# Patient Record
Sex: Female | Born: 1960 | Race: White | Hispanic: No | Marital: Married | Smoking: Current every day smoker
Health system: Southern US, Community
[De-identification: ages and names within clinical notes are randomized; demographics above are authoritative.]

## PROBLEM LIST (undated history)

## (undated) DIAGNOSIS — Z8489 Family history of other specified conditions: Secondary | ICD-10-CM

## (undated) DIAGNOSIS — K5900 Constipation, unspecified: Secondary | ICD-10-CM

## (undated) DIAGNOSIS — E785 Hyperlipidemia, unspecified: Secondary | ICD-10-CM

## (undated) DIAGNOSIS — F419 Anxiety disorder, unspecified: Secondary | ICD-10-CM

## (undated) DIAGNOSIS — F32A Depression, unspecified: Secondary | ICD-10-CM

## (undated) DIAGNOSIS — IMO0001 Reserved for inherently not codable concepts without codable children: Secondary | ICD-10-CM

## (undated) DIAGNOSIS — M519 Unspecified thoracic, thoracolumbar and lumbosacral intervertebral disc disorder: Secondary | ICD-10-CM

## (undated) DIAGNOSIS — T7840XA Allergy, unspecified, initial encounter: Secondary | ICD-10-CM

## (undated) DIAGNOSIS — K589 Irritable bowel syndrome without diarrhea: Secondary | ICD-10-CM

## (undated) DIAGNOSIS — R51 Headache: Secondary | ICD-10-CM

## (undated) DIAGNOSIS — K635 Polyp of colon: Secondary | ICD-10-CM

## (undated) HISTORY — DX: Allergy, unspecified, initial encounter: T78.40XA

## (undated) HISTORY — PX: REDUCTION MAMMAPLASTY: SUR839

## (undated) HISTORY — DX: Irritable bowel syndrome, unspecified: K58.9

## (undated) HISTORY — DX: Constipation, unspecified: K59.00

## (undated) HISTORY — PX: COLONOSCOPY: SHX174

## (undated) HISTORY — PX: ABDOMINAL HYSTERECTOMY: SHX81

## (undated) HISTORY — DX: Polyp of colon: K63.5

## (undated) HISTORY — PX: TONSILLECTOMY: SUR1361

## (undated) HISTORY — DX: Depression, unspecified: F32.A

## (undated) HISTORY — DX: Hyperlipidemia, unspecified: E78.5

## (undated) HISTORY — DX: Unspecified thoracic, thoracolumbar and lumbosacral intervertebral disc disorder: M51.9

## (undated) HISTORY — PX: POLYPECTOMY: SHX149

## (undated) HISTORY — PX: BACK SURGERY: SHX140

---

## 1998-12-15 ENCOUNTER — Ambulatory Visit (HOSPITAL_COMMUNITY): Admission: RE | Admit: 1998-12-15 | Discharge: 1998-12-15 | Payer: Self-pay | Admitting: Family Medicine

## 1998-12-15 ENCOUNTER — Encounter: Payer: Self-pay | Admitting: Family Medicine

## 1998-12-21 ENCOUNTER — Encounter: Admission: RE | Admit: 1998-12-21 | Discharge: 1999-03-21 | Payer: Self-pay | Admitting: Family Medicine

## 1999-07-12 ENCOUNTER — Encounter: Payer: Self-pay | Admitting: Family Medicine

## 1999-07-12 ENCOUNTER — Ambulatory Visit (HOSPITAL_COMMUNITY): Admission: RE | Admit: 1999-07-12 | Discharge: 1999-07-12 | Payer: Self-pay | Admitting: Family Medicine

## 2000-12-26 ENCOUNTER — Ambulatory Visit (HOSPITAL_COMMUNITY): Admission: RE | Admit: 2000-12-26 | Discharge: 2000-12-26 | Payer: Self-pay | Admitting: Unknown Physician Specialty

## 2000-12-26 ENCOUNTER — Encounter: Payer: Self-pay | Admitting: Family Medicine

## 2001-12-26 ENCOUNTER — Ambulatory Visit (HOSPITAL_COMMUNITY): Admission: RE | Admit: 2001-12-26 | Discharge: 2001-12-26 | Payer: Self-pay | Admitting: Internal Medicine

## 2001-12-26 ENCOUNTER — Encounter: Payer: Self-pay | Admitting: Internal Medicine

## 2001-12-26 ENCOUNTER — Encounter (INDEPENDENT_AMBULATORY_CARE_PROVIDER_SITE_OTHER): Payer: Self-pay | Admitting: Specialist

## 2001-12-26 DIAGNOSIS — K449 Diaphragmatic hernia without obstruction or gangrene: Secondary | ICD-10-CM | POA: Insufficient documentation

## 2001-12-26 DIAGNOSIS — D126 Benign neoplasm of colon, unspecified: Secondary | ICD-10-CM | POA: Insufficient documentation

## 2002-02-06 ENCOUNTER — Encounter: Payer: Self-pay | Admitting: Family Medicine

## 2002-02-06 ENCOUNTER — Ambulatory Visit (HOSPITAL_COMMUNITY): Admission: RE | Admit: 2002-02-06 | Discharge: 2002-02-06 | Payer: Self-pay | Admitting: Family Medicine

## 2003-03-05 ENCOUNTER — Ambulatory Visit (HOSPITAL_COMMUNITY): Admission: RE | Admit: 2003-03-05 | Discharge: 2003-03-05 | Payer: Self-pay | Admitting: Family Medicine

## 2003-03-05 ENCOUNTER — Encounter: Payer: Self-pay | Admitting: Family Medicine

## 2003-08-06 ENCOUNTER — Encounter: Admission: RE | Admit: 2003-08-06 | Discharge: 2003-08-06 | Payer: Self-pay | Admitting: Orthopaedic Surgery

## 2003-08-20 ENCOUNTER — Encounter: Admission: RE | Admit: 2003-08-20 | Discharge: 2003-08-20 | Payer: Self-pay | Admitting: Orthopaedic Surgery

## 2003-09-17 ENCOUNTER — Ambulatory Visit (HOSPITAL_COMMUNITY): Admission: RE | Admit: 2003-09-17 | Discharge: 2003-09-18 | Payer: Self-pay | Admitting: Orthopaedic Surgery

## 2004-03-27 ENCOUNTER — Ambulatory Visit (HOSPITAL_COMMUNITY): Admission: RE | Admit: 2004-03-27 | Discharge: 2004-03-27 | Payer: Self-pay | Admitting: Family Medicine

## 2004-07-19 ENCOUNTER — Other Ambulatory Visit: Admission: RE | Admit: 2004-07-19 | Discharge: 2004-07-19 | Payer: Self-pay | Admitting: Family Medicine

## 2005-04-16 ENCOUNTER — Ambulatory Visit (HOSPITAL_COMMUNITY): Admission: RE | Admit: 2005-04-16 | Discharge: 2005-04-16 | Payer: Self-pay | Admitting: Family Medicine

## 2005-05-01 ENCOUNTER — Ambulatory Visit: Payer: Self-pay | Admitting: Internal Medicine

## 2006-04-17 ENCOUNTER — Ambulatory Visit (HOSPITAL_COMMUNITY): Admission: RE | Admit: 2006-04-17 | Discharge: 2006-04-17 | Payer: Self-pay | Admitting: Family Medicine

## 2007-05-14 ENCOUNTER — Ambulatory Visit (HOSPITAL_COMMUNITY): Admission: RE | Admit: 2007-05-14 | Discharge: 2007-05-14 | Payer: Self-pay | Admitting: Family Medicine

## 2007-08-19 ENCOUNTER — Ambulatory Visit: Payer: Self-pay | Admitting: Internal Medicine

## 2007-11-25 DIAGNOSIS — K589 Irritable bowel syndrome without diarrhea: Secondary | ICD-10-CM | POA: Insufficient documentation

## 2007-11-25 DIAGNOSIS — F329 Major depressive disorder, single episode, unspecified: Secondary | ICD-10-CM | POA: Insufficient documentation

## 2007-11-25 DIAGNOSIS — K219 Gastro-esophageal reflux disease without esophagitis: Secondary | ICD-10-CM | POA: Insufficient documentation

## 2007-12-18 ENCOUNTER — Ambulatory Visit: Payer: Self-pay | Admitting: Internal Medicine

## 2008-05-19 ENCOUNTER — Ambulatory Visit (HOSPITAL_COMMUNITY): Admission: RE | Admit: 2008-05-19 | Discharge: 2008-05-19 | Payer: Self-pay | Admitting: Family Medicine

## 2008-06-02 ENCOUNTER — Ambulatory Visit: Payer: Self-pay | Admitting: Family Medicine

## 2008-06-02 DIAGNOSIS — J329 Chronic sinusitis, unspecified: Secondary | ICD-10-CM | POA: Insufficient documentation

## 2008-08-31 ENCOUNTER — Ambulatory Visit: Payer: Self-pay | Admitting: Family Medicine

## 2008-08-31 DIAGNOSIS — J069 Acute upper respiratory infection, unspecified: Secondary | ICD-10-CM | POA: Insufficient documentation

## 2009-05-23 ENCOUNTER — Ambulatory Visit (HOSPITAL_COMMUNITY): Admission: RE | Admit: 2009-05-23 | Discharge: 2009-05-23 | Payer: Self-pay | Admitting: Family Medicine

## 2009-12-12 ENCOUNTER — Encounter (INDEPENDENT_AMBULATORY_CARE_PROVIDER_SITE_OTHER): Payer: Self-pay | Admitting: *Deleted

## 2010-01-13 ENCOUNTER — Encounter (INDEPENDENT_AMBULATORY_CARE_PROVIDER_SITE_OTHER): Payer: Self-pay | Admitting: *Deleted

## 2010-02-14 ENCOUNTER — Encounter (INDEPENDENT_AMBULATORY_CARE_PROVIDER_SITE_OTHER): Payer: Self-pay | Admitting: *Deleted

## 2010-02-15 ENCOUNTER — Ambulatory Visit: Payer: Self-pay | Admitting: Internal Medicine

## 2010-03-01 ENCOUNTER — Ambulatory Visit: Payer: Self-pay | Admitting: Internal Medicine

## 2010-05-26 ENCOUNTER — Ambulatory Visit (HOSPITAL_COMMUNITY): Admission: RE | Admit: 2010-05-26 | Discharge: 2010-05-26 | Payer: Self-pay | Admitting: Family Medicine

## 2010-06-15 ENCOUNTER — Encounter: Admission: RE | Admit: 2010-06-15 | Discharge: 2010-06-15 | Payer: Self-pay | Admitting: Family Medicine

## 2010-10-08 HISTORY — PX: BREAST SURGERY: SHX581

## 2010-11-07 NOTE — Letter (Signed)
Summary: Colonoscopy Letter  [facility]  [address] [place], [place] [address]   Phone: [phone]  Fax: [phone]      December 12, 2009 MRN: [id number]   [facility] [address] [place], [place]  [address]   Dear Ms. Tuff,   According to your medical record, it is time for you to schedule a Colonoscopy. The American Cancer Society recommends this procedure as a method to detect early colon cancer. Patients with a family history of colon cancer, or a personal history of colon polyps or inflammatory bowel disease are at increased risk.  This letter has been generated based on the recommendations made at the time of your procedure. If you feel that in your particular situation this may no longer apply, please contact our office.  Please call our office at [phone] to schedule this appointment or to update your records at your earliest convenience.  Thank you for cooperating with [place] to provide you with the very best care possible.   Sincerely,  Wilhemina Bonito. Marina Goodell, M.D.  [facility] [phone]

## 2010-11-07 NOTE — Procedures (Signed)
Summary: Colonoscopy  Patient: Stephanie Deleon Note: All result statuses are Final unless otherwise noted.  Tests: (1) Colonoscopy (COL)   COL Colonoscopy           DONE     Stephanie Deleon     [address]     [place], [place]  [address]           COLONOSCOPY PROCEDURE REPORT           PATIENT:  Stephanie, Deleon  MR#:  [id number]     BIRTHDATE:  [birth year], 49 yrs. old  GENDER:  female     ENDOSCOPIST:  Wilhemina Bonito. Eda Keys, MD     REF. BY:  Screening / Recall     PROCEDURE DATE:  03/01/2010     PROCEDURE:  Higher-risk screening colonoscopy G0105           ASA CLASS:  Class I     INDICATIONS:  family history of colon cancer ; PARENT 60'S.     NEGATIVE COLONOSCOPY 2003     MEDICATIONS:   Fentanyl 100 mcg IV, Versed 10 mg IV           DESCRIPTION OF PROCEDURE:   After the risks benefits and     alternatives of the procedure were thoroughly explained, informed     consent was obtained.  Digital rectal exam was performed and     revealed no abnormalities.   The LB CF-H180AL [id number] endoscope     was introduced through the anus and advanced to the cecum, which     was identified by both the appendix and ileocecal valve, without     limitations.TIME TO CECUM = 4:26 MIN.The quality of the prep was     excellent, using MoviPrep.  The instrument was then slowly     withdrawn (TIME = 12:49 MIN) as the colon was fully examined.     <<PROCEDUREIMAGES>>           FINDINGS:  A normal appearing cecum, ileocecal valve, and     appendiceal orifice were identified. The ascending, hepatic     flexure, transverse, splenic flexure, descending, sigmoid colon,     and rectum appeared unremarkable.  No polyps or cancers were seen.     Retroflexed views in the rectum revealed no abnormalities.    The     scope was then withdrawn from the patient and the procedure     completed.           COMPLICATIONS:  None     ENDOSCOPIC IMPRESSION:     1) Normal colon     2) No polyps or cancers        RECOMMENDATIONS:     1) Follow up colonoscopy in 5 years for Family history           ______________________________     Wilhemina Bonito. Eda Keys, MD           CC:  Holley Bouche, MD; The Patient           n.     eSIGNED:   Wilhemina Bonito. Eda Keys at 03/01/2010 11:41 AM           [place], [id number]  Note: An exclamation mark (!) indicates a result that was not dispersed into the flowsheet. Document Creation Date: 03/01/2010 11:42 AM _______________________________________________________________________  (1) Order result status: Final Collection or observation date-time: 03/01/2010 11:36 Requested date-time:  Receipt date-time:  Reported date-time:  Referring Physician:  Ordering Physician: Fransico Setters [phone]) Specimen Source:  Source: Launa Grill Order Number: [id number] Lab site:   Appended Document: Colonoscopy    Clinical Lists Changes  Observations: Added new observation of COLONNXTDUE: 02/2015 (03/01/2010 14:37)

## 2010-11-07 NOTE — Miscellaneous (Signed)
Summary: [facility] previsit  Clinical Lists Changes  Medications: Added new medication of MOVIPREP 100 GM  SOLR (PEG-KCL-NACL-NASULF-NA ASC-C) As per prep instructions. - Signed Rx of MOVIPREP 100 GM  SOLR (PEG-KCL-NACL-NASULF-NA ASC-C) As per prep instructions.;  #1 x 0;  Signed;  Entered by: Karl Bales RN;  Authorized by: Hilarie Fredrickson MD;  Method used: Electronically to [id number]*, [address] [place], [place], [place]  [address], Ph: [phone], Fax: [phone] Observations: Added new observation of NKA: T (02/15/2010 16:03)    Prescriptions: MOVIPREP 100 GM  SOLR (PEG-KCL-NACL-NASULF-NA ASC-C) As per prep instructions.  #1 x 0   Entered by:   Karl Bales RN   Authorized by:   Hilarie Fredrickson MD   Signed by:   Karl Bales RN on 02/15/2010   Method used:   Electronically to        [facility] [phone]* (retail)       [address] [place], [place]  [address]       Ph: [phone]       Fax: [phone]   RxID:   [id number]

## 2010-11-07 NOTE — Letter (Signed)
Summary: [facility] Instructions  [facility]  [address] [place], [place] [address]   Phone: [phone]  Fax: [phone]       Stephanie Deleon    10/02/1961    MRN: [id number]        Procedure Day /Date:WEDNESDAY  03/01/10     Arrival Time:  9:00am     Procedure Time:  10:00am     Location of Procedure:                    Juliann Pares _  [facility] (4th Floor)                        PREPARATION FOR COLONOSCOPY WITH MOVIPREP   Starting 5 days prior to your procedure  FRIDAY 05/20  do not eat nuts, seeds, popcorn, corn, beans, peas,  salads, or any raw vegetables.  Do not take any fiber supplements (e.g. Metamucil, Citrucel, and Benefiber).  THE DAY BEFORE YOUR PROCEDURE         DATE: TUESDAY 05/24  1.  Drink clear liquids the entire day-NO SOLID FOOD  2.  Do not drink anything colored red or purple.  Avoid juices with pulp.  No orange juice.  3.  Drink at least 64 oz. (8 glasses) of fluid/clear liquids during the day to prevent dehydration and help the prep work efficiently.  CLEAR LIQUIDS INCLUDE: Water Jello Ice Popsicles Tea (sugar ok, no milk/cream) Powdered fruit flavored drinks Coffee (sugar ok, no milk/cream) Gatorade Juice: apple, white grape, white cranberry  Lemonade Clear bullion, consomm, broth Carbonated beverages (any kind) Strained chicken noodle soup Hard Candy                             4.  In the morning, mix first dose of MoviPrep solution:    Empty 1 Pouch A and 1 Pouch B into the disposable container    Add lukewarm drinking water to the top line of the container. Mix to dissolve    Refrigerate (mixed solution should be used within 24 hrs)  5.  Begin drinking the prep at 5:00 p.m. The MoviPrep container is divided by 4 marks.   Every 15 minutes drink the solution down to the next mark (approximately 8 oz) until the full liter is complete.   6.  Follow completed prep with 16 oz of clear liquid of your choice  (Nothing red or purple).  Continue to drink clear liquids until bedtime.  7.  Before going to bed, mix second dose of MoviPrep solution:    Empty 1 Pouch A and 1 Pouch B into the disposable container    Add lukewarm drinking water to the top line of the container. Mix to dissolve    Refrigerate  THE DAY OF YOUR PROCEDURE      DATE: [facility]  05/25  Beginning at 5:00 a.m. (5 hours before procedure):         1. Every 15 minutes, drink the solution down to the next mark (approx 8 oz) until the full liter is complete.  2. Follow completed prep with 16 oz. of clear liquid of your choice.    3. You may drink clear liquids until 8:00am  (2 HOURS BEFORE PROCEDURE).   MEDICATION INSTRUCTIONS  Unless otherwise instructed, you should take regular prescription medications with a small sip of water   as early as possible the morning  of your procedure.        OTHER INSTRUCTIONS  You will need a responsible adult at least 52 years of age to accompany you and drive you home.   This person must remain in the waiting room during your procedure.  Wear loose fitting clothing that is easily removed.  Leave jewelry and other valuables at home.  However, you may wish to bring a book to read or  an iPod/MP3 player to listen to music as you wait for your procedure to start.  Remove all body piercing jewelry and leave at home.  Total time from sign-in until discharge is approximately 2-3 hours.  You should go home directly after your procedure and rest.  You can resume normal activities the  day after your procedure.  The day of your procedure you should not:   Drive   Make legal decisions   Operate machinery   Drink alcohol   Return to work  You will receive specific instructions about eating, activities and medications before you leave.    The above instructions have been reviewed and explained to me by  Karl Bales RN  Feb 15, 2010 4:22 PM    I fully understand and  can verbalize these instructions _____________________________ Date _________

## 2010-11-07 NOTE — Letter (Signed)
Summary: Previsit letter  [facility]  [address] [place], [place] [address]   Phone: [phone]  Fax: [phone]       01/13/2010 MRN: [id number]  [facility] [address] [place], [place]  [address]  Dear Ms. Librarian, academic,  Welcome to the Gastroenterology Division at [facility].    You are scheduled to see a nurse for your pre-procedure visit on 02-15-10 at 4:30p.m. on the 3rd floor at [facility], [address]  We ask that you try to arrive at our office 15 minutes prior to your appointment time to allow for check-in.  Your nurse visit will consist of discussing your medical and surgical history, your immediate family medical history, and your medications.    Please bring a complete list of all your medications or, if you prefer, bring the medication bottles and we will list them.  We will need to be aware of both prescribed and over the counter drugs.  We will need to know exact dosage information as well.  If you are on blood thinners (Coumadin, Plavix, Aggrenox, Ticlid, etc.) please call our office today/prior to your appointment, as we need to consult with your physician about holding your medication.   Please be prepared to read and sign documents such as consent forms, a financial agreement, and acknowledgement forms.  If necessary, and with your consent, a friend or relative is welcome to sit-in on the nurse visit with you.  Please bring your insurance card so that we may make a copy of it.  If your insurance requires a referral to see a specialist, please bring your referral form from your primary care physician.  No co-pay is required for this nurse visit.     If you cannot keep your appointment, please call [phone] to cancel or reschedule prior to your appointment date.  This allows [place] the opportunity to schedule an appointment for another patient in need of care.    Thank you for choosing [facility] for your medical  needs.  We appreciate the opportunity to care for you.  Please visit [place] at our website  to learn more about our practice.                     Sincerely.                                                                                                                   The Gastroenterology Division

## 2011-02-20 NOTE — Assessment & Plan Note (Signed)
[facility]                         GASTROENTEROLOGY OFFICE NOTE   Stephanie Deleon, Stephanie Deleon                     MRN:          [id number]  DATE:08/19/2007                            DOB:          1961/08/31    REASON FOR EVALUATION:  Abdominal discomfort, constipation, and need for  colonoscopy.   HISTORY:  This is a 50 year old white female with a history of  constipation, predominant irritable bowel syndrome, who presents today  regarding abdominal complaints and the need for colonoscopy.  Patient  has longstanding problems with constipation associated with bloating and  abdominal discomfort.  Symptoms are improved post-defecation.  She has  taken multiple agents over the years to help her bowels with variable  success.  Currently taking nothing on a regular basis.  She had been on  MiraLax, although has not been using it regularly.  Her last colonoscopy  was performed in March, 2003.  She was found to have a diminutive  hyperplastic colon polyp.  No other abnormalities.  Since that exam, her  father was diagnosed with colon cancer and had a large of his colon  surgically resected.  This was about four years ago.   PAST MEDICAL HISTORY:  Unremarkable.   PAST SURGICAL HISTORY:  1. Hysterectomy.  2. Lumbar diskectomy.   ALLERGIES:  No known drug allergies.   CURRENT MEDICATIONS:  1. Wellbutrin XR 300 mg daily.  2. Esterace 2 mg daily.   FAMILY HISTORY:  Father with colon cancer.   SOCIAL HISTORY:  Patient is remarried.  She has three children.  She  smokes.   PHYSICAL EXAMINATION:  A well-appearing female in no acute distress.  Blood pressure 108/68, heart rate 68.  Weight is 188 pounds.  HEENT:  Sclerae are anicteric.  Conjunctivae are pink.  Oral mucosa  intact.  No adenopathy.  LUNGS:  Clear.  HEART:  Regular.  ABDOMEN:  Soft without tenderness, mass, or hernia.  Good bowel sounds  heard.   IMPRESSION:  1. Constipation, predominant  irritable bowel syndrome, ongoing.  2. Family history of colon cancer in a first degree relative.  3. Personal history of hyperplastic colon polyp.   RECOMMENDATIONS:  1. MiraLax daily.  Titrate to need.  2. Schedule colonoscopy with polypectomy if necessary.  The nature of      the procedure, as well as risks, benefits and alternatives were      reviewed in detail.  She understood and agreed to proceed.  3. Ongoing general medical care with Dr. Tiburcio Pea.     Wilhemina Bonito. Marina Goodell, MD  Electronically Signed    JNP/MedQ  DD: 08/19/2007  DT: 08/19/2007  Job #: [id number]   cc:   Holley Bouche, M.D.

## 2011-02-23 NOTE — Op Note (Signed)
Stephanie Deleon, KISNER                     ACCOUNT NO.:  [id number]   MEDICAL RECORD NO.:  [id number]                   PATIENT TYPE:  OIB   LOCATION:  2899                                 FACILITY:  [facility]   PHYSICIAN:  Mark C. Ophelia Charter, M.D.                 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   PROCEDURE:  Right L3-4 microdiskectomy.   SURGEON:  Mark C. Ophelia Charter, M.D.   ASSISTANT:  Genene Churn. Denton Meek.   ANESTHESIA:  General endotracheal anesthesia with orotracheal intubation.   ESTIMATED BLOOD LOSS:  Less than 5 mL.   PROCEDURE:  After induction of general anesthesia with orotracheal  intubation and preoperative Ancef prophylaxis the patient was placed on the  Andrews frame in the LOA position with careful padding. The back was prepped  with Duraprep, the area was scrubbed with towels, Betadine, Vi-Drape  application and laminectomy sheet.  A cross table lateral x-ray confirmed  the appropriate placement of the spinal needle at the 3-4 space.  An  incision was made a couple of mm off the midline at the 3-4 space using the  spinal needle position and the middle of the incision.  A subperiosteal  dissection of the lamina was performed and the [facility] retractor was placed  lateral with a five pound weight.  Laminotomies were performed at L3-4 and  subset over the annulus was removed with a Kerrison rongeur.  The operative  microscope was draped and brought in and with the use of the microscope the  ligamentum was incised with a 15 scalpel blade and removed with the  Kerrison.  A pad is used to protect the dura.  The foramina was enlarged  slightly.  The patient had a lateral disk and actually there was compression  of  the L4 nerve root far laterally.  Some veins in the gutter were  coagulated with the bipolar cautery and the Scarlette Slice was used to pull the dura  slightly over towards the midline.  The annulus was incised, passes were  made with straight initially and then a micro down biter and a pituitary.  Using the hockey stick sweeping out underneath the annulus followed by the  regular down biter, several large chunks of disk were removed.  An Epstein  curette was used, large size, and by pushing outside of the disk pieces were  pushed back toward the middle and then grasped from the inside with the  micro down biter and then delivered.  This was repeated.  The nerve root was  gently palpated with the tipped hockey stick in the area where it was  compressed and once the disk fragments were removed and the nerve  root was  completely decompressed, the operative field was irrigated with saline  solution.  Some passes were made at the midline and there was only a minimal  amount of remaining degenerative material.  The upper field was dry and the  microscope was removed.  The Scarlette Slice was removed.  The fascia was closed with  0 Vicryl, 2-0 Vicryl on the subcutaneous tissue, 3-0 Prolene subcuticular  skin closure, tincture of Benzoin, Steri-Strips and Marcaine infiltration in  the skin along with a postoperative dressing.  The patient was transferred  to the recovery room in stable condition.                                              Mark C. Ophelia Charter, M.D.   MCY/MEDQ  D:  09/17/2003  T:  09/18/2003  Job:  [id number]

## 2011-07-18 ENCOUNTER — Other Ambulatory Visit (HOSPITAL_COMMUNITY): Payer: Self-pay | Admitting: Family Medicine

## 2011-09-03 ENCOUNTER — Other Ambulatory Visit: Payer: Self-pay | Admitting: *Deleted

## 2011-09-03 DIAGNOSIS — N644 Mastodynia: Secondary | ICD-10-CM

## 2011-09-20 ENCOUNTER — Other Ambulatory Visit: Payer: Self-pay | Admitting: *Deleted

## 2011-09-20 ENCOUNTER — Ambulatory Visit
Admission: RE | Admit: 2011-09-20 | Discharge: 2011-09-20 | Disposition: A | Discharge status: Home / Self Care | Payer: Federal, State, Local not specified - PPO | Source: Ambulatory Visit | Attending: *Deleted | Admitting: *Deleted

## 2011-09-20 DIAGNOSIS — N644 Mastodynia: Secondary | ICD-10-CM

## 2011-09-20 DIAGNOSIS — N6002 Solitary cyst of left breast: Secondary | ICD-10-CM

## 2012-02-25 ENCOUNTER — Other Ambulatory Visit: Payer: Self-pay | Admitting: Family Medicine

## 2012-02-25 DIAGNOSIS — N644 Mastodynia: Secondary | ICD-10-CM

## 2012-03-04 ENCOUNTER — Ambulatory Visit
Admission: RE | Admit: 2012-03-04 | Discharge: 2012-03-04 | Disposition: A | Discharge status: Home / Self Care | Payer: Federal, State, Local not specified - PPO | Source: Ambulatory Visit | Attending: Family Medicine | Admitting: Family Medicine

## 2012-03-04 ENCOUNTER — Other Ambulatory Visit: Payer: Self-pay | Admitting: Family Medicine

## 2012-03-04 DIAGNOSIS — N644 Mastodynia: Secondary | ICD-10-CM

## 2012-10-23 ENCOUNTER — Other Ambulatory Visit (HOSPITAL_COMMUNITY): Payer: Self-pay | Admitting: Family Medicine

## 2012-10-23 DIAGNOSIS — Z1231 Encounter for screening mammogram for malignant neoplasm of breast: Secondary | ICD-10-CM

## 2012-10-31 ENCOUNTER — Ambulatory Visit (HOSPITAL_COMMUNITY): Payer: Federal, State, Local not specified - PPO

## 2012-11-07 ENCOUNTER — Ambulatory Visit (HOSPITAL_COMMUNITY)
Admission: RE | Admit: 2012-11-07 | Discharge: 2012-11-07 | Disposition: A | Discharge status: Home / Self Care | Payer: Federal, State, Local not specified - PPO | Source: Ambulatory Visit | Attending: Family Medicine | Admitting: Family Medicine

## 2012-11-07 DIAGNOSIS — Z1231 Encounter for screening mammogram for malignant neoplasm of breast: Secondary | ICD-10-CM | POA: Insufficient documentation

## 2012-11-11 ENCOUNTER — Other Ambulatory Visit: Payer: Self-pay | Admitting: Family Medicine

## 2012-11-11 DIAGNOSIS — R928 Other abnormal and inconclusive findings on diagnostic imaging of breast: Secondary | ICD-10-CM

## 2012-11-25 ENCOUNTER — Ambulatory Visit
Admission: RE | Admit: 2012-11-25 | Discharge: 2012-11-25 | Disposition: A | Discharge status: Home / Self Care | Payer: Federal, State, Local not specified - PPO | Source: Ambulatory Visit | Attending: Family Medicine | Admitting: Family Medicine

## 2012-11-25 DIAGNOSIS — R928 Other abnormal and inconclusive findings on diagnostic imaging of breast: Secondary | ICD-10-CM

## 2013-02-12 ENCOUNTER — Telehealth: Payer: Self-pay | Admitting: Internal Medicine

## 2013-02-12 ENCOUNTER — Ambulatory Visit: Payer: Federal, State, Local not specified - PPO | Admitting: Nurse Practitioner

## 2013-02-12 NOTE — Telephone Encounter (Signed)
Pt states she was seen in the ER at [facility] for nausea, vomiting, and abdominal pain. Pt states she is still not better. Pt requesting to be seen. Pt scheduled to see Willette Cluster NP today at 2pm. Pt aware of appt date and time. Called and requested records from [facility] be faxed from her ER visit.

## 2013-04-22 ENCOUNTER — Other Ambulatory Visit: Payer: Self-pay | Admitting: Family Medicine

## 2013-04-22 DIAGNOSIS — R921 Mammographic calcification found on diagnostic imaging of breast: Secondary | ICD-10-CM

## 2013-06-15 ENCOUNTER — Other Ambulatory Visit: Payer: Self-pay | Admitting: Family Medicine

## 2013-06-15 ENCOUNTER — Ambulatory Visit
Admission: RE | Admit: 2013-06-15 | Discharge: 2013-06-15 | Disposition: A | Discharge status: Home / Self Care | Payer: Federal, State, Local not specified - PPO | Source: Ambulatory Visit | Attending: Family Medicine | Admitting: Family Medicine

## 2013-06-15 DIAGNOSIS — R921 Mammographic calcification found on diagnostic imaging of breast: Secondary | ICD-10-CM

## 2013-12-01 ENCOUNTER — Other Ambulatory Visit: Payer: Self-pay | Admitting: Orthopaedic Surgery

## 2013-12-01 DIAGNOSIS — M47812 Spondylosis without myelopathy or radiculopathy, cervical region: Secondary | ICD-10-CM

## 2013-12-01 DIAGNOSIS — M79602 Pain in left arm: Secondary | ICD-10-CM

## 2013-12-06 DIAGNOSIS — IMO0001 Reserved for inherently not codable concepts without codable children: Secondary | ICD-10-CM

## 2013-12-06 HISTORY — DX: Reserved for inherently not codable concepts without codable children: IMO0001

## 2013-12-08 ENCOUNTER — Ambulatory Visit
Admission: RE | Admit: 2013-12-08 | Discharge: 2013-12-08 | Disposition: A | Discharge status: Home / Self Care | Payer: Federal, State, Local not specified - PPO | Source: Ambulatory Visit | Attending: Orthopaedic Surgery | Admitting: Orthopaedic Surgery

## 2013-12-08 DIAGNOSIS — M79602 Pain in left arm: Secondary | ICD-10-CM

## 2013-12-08 DIAGNOSIS — M47812 Spondylosis without myelopathy or radiculopathy, cervical region: Secondary | ICD-10-CM

## 2013-12-25 ENCOUNTER — Other Ambulatory Visit (HOSPITAL_COMMUNITY): Payer: Self-pay | Admitting: Orthopaedic Surgery

## 2013-12-28 ENCOUNTER — Encounter (HOSPITAL_COMMUNITY): Payer: Self-pay | Admitting: Pharmacy Technician

## 2013-12-30 ENCOUNTER — Encounter (HOSPITAL_COMMUNITY)
Admission: RE | Admit: 2013-12-30 | Discharge: 2013-12-30 | Disposition: A | Discharge status: Home / Self Care | Payer: Federal, State, Local not specified - PPO | Source: Ambulatory Visit | Attending: Orthopaedic Surgery | Admitting: Orthopaedic Surgery

## 2013-12-30 ENCOUNTER — Ambulatory Visit (HOSPITAL_COMMUNITY)
Admission: RE | Admit: 2013-12-30 | Discharge: 2013-12-30 | Disposition: A | Discharge status: Home / Self Care | Payer: Federal, State, Local not specified - PPO | Source: Ambulatory Visit | Attending: Orthopaedic Surgery | Admitting: Orthopaedic Surgery

## 2013-12-30 ENCOUNTER — Encounter (HOSPITAL_COMMUNITY): Payer: Self-pay

## 2013-12-30 DIAGNOSIS — Z01818 Encounter for other preprocedural examination: Secondary | ICD-10-CM | POA: Insufficient documentation

## 2013-12-30 DIAGNOSIS — R059 Cough, unspecified: Secondary | ICD-10-CM | POA: Insufficient documentation

## 2013-12-30 DIAGNOSIS — Z01810 Encounter for preprocedural cardiovascular examination: Secondary | ICD-10-CM | POA: Insufficient documentation

## 2013-12-30 DIAGNOSIS — R05 Cough: Secondary | ICD-10-CM | POA: Insufficient documentation

## 2013-12-30 DIAGNOSIS — J3489 Other specified disorders of nose and nasal sinuses: Secondary | ICD-10-CM | POA: Insufficient documentation

## 2013-12-30 HISTORY — DX: Reserved for inherently not codable concepts without codable children: IMO0001

## 2013-12-30 HISTORY — DX: Family history of other specified conditions: Z84.89

## 2013-12-30 HISTORY — DX: Anxiety disorder, unspecified: F41.9

## 2013-12-30 HISTORY — DX: Headache: R51

## 2013-12-30 LAB — COMPREHENSIVE METABOLIC PANEL
ALK PHOS: 100 U/L (ref 39–117)
ALT: 36 U/L — AB (ref 0–35)
AST: 28 U/L (ref 0–37)
Albumin: 3.9 g/dL (ref 3.5–5.2)
BUN: 15 mg/dL (ref 6–23)
CO2: 28 meq/L (ref 19–32)
Calcium: 9.5 mg/dL (ref 8.4–10.5)
Chloride: 100 mEq/L (ref 96–112)
Creatinine, Ser: 0.92 mg/dL (ref 0.50–1.10)
GFR calc non Af Amer: 69 mL/min — ABNORMAL LOW (ref >90)
GFR, EST AFRICAN AMERICAN: 80 mL/min — AB (ref >90)
GLUCOSE: 83 mg/dL (ref 70–99)
POTASSIUM: 4.1 meq/L (ref 3.7–5.3)
SODIUM: 141 meq/L (ref 137–147)
TOTAL PROTEIN: 7.3 g/dL (ref 6.0–8.3)
Total Bilirubin: <0.2 mg/dL — ABNORMAL LOW (ref 0.3–1.2)

## 2013-12-30 LAB — SURGICAL PCR SCREEN
MRSA, PCR: NEGATIVE
STAPHYLOCOCCUS AUREUS: NEGATIVE

## 2013-12-30 LAB — URINE MICROSCOPIC-ADD ON

## 2013-12-30 LAB — URINALYSIS, ROUTINE W REFLEX MICROSCOPIC
BILIRUBIN URINE: NEGATIVE
Glucose, UA: NEGATIVE mg/dL
HGB URINE DIPSTICK: NEGATIVE
Ketones, ur: NEGATIVE mg/dL
Nitrite: NEGATIVE
PROTEIN: NEGATIVE mg/dL
Specific Gravity, Urine: 1.021 (ref 1.005–1.030)
UROBILINOGEN UA: 0.2 mg/dL (ref 0.0–1.0)
pH: 6 (ref 5.0–8.0)

## 2013-12-30 LAB — CBC
HCT: 40.3 % (ref 36.0–46.0)
HEMOGLOBIN: 13.9 g/dL (ref 12.0–15.0)
MCH: 32.2 pg (ref 26.0–34.0)
MCHC: 34.5 g/dL (ref 30.0–36.0)
MCV: 93.3 fL (ref 78.0–100.0)
Platelets: 227 10*3/uL (ref 150–400)
RBC: 4.32 MIL/uL (ref 3.87–5.11)
RDW: 13 % (ref 11.5–15.5)
WBC: 5.6 10*3/uL (ref 4.0–10.5)

## 2013-12-30 LAB — APTT: APTT: 31 s (ref 24–37)

## 2013-12-30 LAB — PROTIME-INR
INR: 0.9 (ref 0.00–1.49)
PROTHROMBIN TIME: 12 s (ref 11.6–15.2)

## 2013-12-30 NOTE — Pre-Procedure Instructions (Signed)
Stephanie Deleon  12/30/2013   Your procedure is scheduled on: Friday March 27 th at 0950 AM  Report to [facility] entrance "A" at **0750 AM.  Call this number if you have problems the morning of surgery: [phone]   Remember:   Do not eat food or drink liquids after midnight Thursday.   Take these medicines the morning of surgery with A SIP OF WATER: Xyzal and Singulair   Do not wear jewelry, make-up or nail polish.  Do not wear lotions, powders, or perfumes. You may wear deodorant.  Do not shave 48 hours prior to surgery.    Do not bring valuables to the hospital.  [facility] is not responsible for any belongings or valuables.               Contacts, dentures or bridgework may not be worn into surgery.  Leave suitcase in the car. After surgery it may be brought to your room.  For patients admitted to the hospital, discharge time is determined by your treatment team.               Patients discharged the day of surgery will not be allowed to drive home.    Special Instructions: [facility] - Preparing for Surgery  Before surgery, you can play an important role.  Because skin is not sterile, your skin needs to be as free of germs as possible.  You can reduce the number of germs on you skin by washing with CHG (chlorahexidine gluconate) soap before surgery.  CHG is an antiseptic cleaner which kills germs and bonds with the skin to continue killing germs even after washing.  Please DO NOT use if you have an allergy to CHG or antibacterial soaps.  If your skin becomes reddened/irritated stop using the CHG and inform your nurse when you arrive at Short Stay.  Do not shave (including legs and underarms) for at least 48 hours prior to the first CHG shower.  You may shave your face.  Please follow these instructions carefully:   1.  Shower with CHG Soap the night before surgery and the                                morning of Surgery.  2.  If you choose to wash your hair, wash your  hair first as usual with your       normal shampoo.  3.  After you shampoo, rinse your hair and body thoroughly to remove the                      Shampoo.  4.  Use CHG as you would any other liquid soap.  You can apply chg directly       to the skin and wash gently with scrungie or a clean washcloth.  5.  Apply the CHG Soap to your body ONLY FROM THE NECK DOWN.        Do not use on open wounds or open sores.  Avoid contact with your eyes,       ears, mouth and genitals (private parts).  Wash genitals (private parts)       with your normal soap.  6.  Wash thoroughly, paying special attention to the area where your surgery        will be performed.  7.  Thoroughly rinse your body with warm water from the neck  down.  8.  DO NOT shower/wash with your normal soap after using and rinsing off       the CHG Soap.  9.  Pat yourself dry with a clean towel.            10.  Wear clean pajamas.            11.  Place clean sheets on your bed the night of your first shower and do not        sleep with pets.  Day of Surgery  Do not apply any lotions/deoderants the morning of surgery.  Please wear clean clothes to the hospital/surgery center.      Please read over the following fact sheets that you were given: Pain Booklet, Coughing and Deep Breathing, MRSA Information and Surgical Site Infection Prevention

## 2013-12-30 NOTE — H&P (Signed)
[facility]   A Division of [employer] [facility]   [address], [place], [place] [address] Telephone: [phone]  Fax: [phone]     PATIENT: Stephanie Deleon, Stephanie Deleon   MR#: [id number]  DOB: 1960/10/26       A 53 year old female returns with ongoing neck pain which has been followed for 3 years.  She has been treated with physical therapy in 2014, used anti-inflammatories with persistent moderately severe neck and shoulder pain.  She is taking anti-inflammatories.  The pain wakes her up at night.  It bothers her on a daily basis.  She has numbness and tingling that radiate to her left arm and hand in the C6 distribution.  A Medrol Dosepak has been used as well as Voltaren, Celebrex, meloxicam in the past.  She denies any fever or chills.  No lower extremity numbness or tingling.  Medications include Voltaren 75 mg p.o. b.i.d.  Plain radiographs demonstrated cervical spondylitic changes midcervical spine and the patient had a previous MRI in 2007 which showed spondylosis at C4-5 and C5-6.  Due to her progressive increasing symptoms with increasing left arm weakness and numbness, a new MRI scan was obtained and is available for review.     MRI 12/09/2013 shows central and left disk protrusion with uncinate spurring, left C5 nerve root impingement, C5-6 disk space narrowing, central and left disk protrusion with uncinate spurring and effacement of the anterior subarachnoid space without core flattening, left greater than right C6 nerve root impingement.  Other levels showed no compression.     ALLERGIES:   The patient has no known drug allergies.    PAST SURGICAL HISTORY:   Previous lumbar procedure 2004 for degenerative disk which did well.     FAMILY HISTORY:   Negative for heart disease.  Significant spondylosis, degenerative disk disease, cancer.   SOCIAL HISTORY:   She is married to her husband Ludwig Clarks.  She works as an Psychologist, occupational.  She was 30 year less  than a half pack smoker.  Occasionally consumes alcohol.     REVIEW OF SYSTEMS:   A 14-point review of systems positive for acid reflux, depression, migraines and past history of anxiety, not on any current medications.   PHYSICAL EXAMINATION:  The patient alert and oriented, WD, WN, NAD.   Extraocular movements intact.  Height 5 feet 5 inches.  Weight 190 pounds.  Bilateral brachial plexus tenderness severe on the left, moderate on the right.  Positive Spurling on the left.  She has persistent lateral epicondylar tenderness.  Impingement of the left shoulder is negative.  Decreased sensation on the left C6 distribution on the left and normal on the right.  Trace brachial radialis reflex on the left, 2+ on the right.  The biceps is 2+ and symmetric and triceps are 2+ and symmetrical.  Slight wrist extension weakness on the left, normal on the right.  Lower extremity reflexes are 2+.  Lungs are clear.  Heart:  Regular rate and rhythm.  Abdomen is soft and nontender.  Lower extremities:  Normal muscle strength, normal reflexes, negative Homans sign.  Normal heel-toe gait.  Upper extremities show no evidence of median nerve entrapment.  She has tenderness over the lateral epicondyle on the left.  Median nerve, ulnar nerve at the elbow are normal.     ASSESSMENT:  Two level cervical spondylosis with disk protrusion and foraminal narrowing at C4-5 and C5-6. She has been through physical therapy, cervical traction, Medrol Dosepak, anti-inflammatories.  Her  symptoms have been present ongoing for 3 years and has been progressively become more severe in the last year with progression on MRI at the 2 level C4-5 and C5-6.     PLAN:  We discussed cervical fusion 2 levels, anterior cervical discectomy and plate using allograft at C4-5 and C5-6. Overnight stay in the hospital, use the operative microscope.  Risk of surgery including pseudoarthrosis, dysaphia, dysphonia, bleeding, infection, neurovascular structure damage,  dural tear, vertebral artery injury.  Risks of surgery were discussed, outlined the treatment plan, use postoperative collar for 6 weeks discussed, she understands and request that we proceed.        Mark C. Lorin Mercy, M.D.    Auto-Authenticated by Thana Farr. Lorin Mercy, M.D.

## 2013-12-31 MED ORDER — CEFAZOLIN SODIUM-DEXTROSE 2-3 GM-% IV SOLR
2.0000 g | INTRAVENOUS | Status: AC
  Administered 2014-01-01: 2 g via INTRAVENOUS
  Filled 2013-12-31: qty 50

## 2014-01-01 ENCOUNTER — Inpatient Hospital Stay (HOSPITAL_COMMUNITY)
Admission: RE | Admit: 2014-01-01 | Discharge: 2014-01-02 | DRG: 473 | Disposition: A | Discharge status: Home / Self Care | Payer: Federal, State, Local not specified - PPO | Source: Ambulatory Visit | Attending: Orthopaedic Surgery | Admitting: Orthopaedic Surgery

## 2014-01-01 ENCOUNTER — Ambulatory Visit (HOSPITAL_COMMUNITY): Payer: Federal, State, Local not specified - PPO

## 2014-01-01 ENCOUNTER — Ambulatory Visit (HOSPITAL_COMMUNITY): Payer: Federal, State, Local not specified - PPO | Admitting: Certified Registered Nurse Anesthetist

## 2014-01-01 ENCOUNTER — Encounter (HOSPITAL_COMMUNITY): Payer: Self-pay | Admitting: *Deleted

## 2014-01-01 ENCOUNTER — Encounter (HOSPITAL_COMMUNITY)
Admission: RE | Disposition: A | Discharge status: Home / Self Care | Payer: Self-pay | Source: Ambulatory Visit | Attending: Orthopaedic Surgery

## 2014-01-01 ENCOUNTER — Encounter (HOSPITAL_COMMUNITY): Payer: Federal, State, Local not specified - PPO | Admitting: Certified Registered Nurse Anesthetist

## 2014-01-01 DIAGNOSIS — F17200 Nicotine dependence, unspecified, uncomplicated: Secondary | ICD-10-CM | POA: Diagnosis present

## 2014-01-01 DIAGNOSIS — F411 Generalized anxiety disorder: Secondary | ICD-10-CM | POA: Diagnosis present

## 2014-01-01 DIAGNOSIS — Z6831 Body mass index (BMI) 31.0-31.9, adult: Secondary | ICD-10-CM

## 2014-01-01 DIAGNOSIS — F329 Major depressive disorder, single episode, unspecified: Secondary | ICD-10-CM | POA: Diagnosis present

## 2014-01-01 DIAGNOSIS — M47812 Spondylosis without myelopathy or radiculopathy, cervical region: Principal | ICD-10-CM | POA: Diagnosis present

## 2014-01-01 DIAGNOSIS — F3289 Other specified depressive episodes: Secondary | ICD-10-CM | POA: Diagnosis present

## 2014-01-01 DIAGNOSIS — IMO0002 Reserved for concepts with insufficient information to code with codable children: Secondary | ICD-10-CM | POA: Diagnosis present

## 2014-01-01 DIAGNOSIS — K219 Gastro-esophageal reflux disease without esophagitis: Secondary | ICD-10-CM | POA: Diagnosis present

## 2014-01-01 HISTORY — PX: ANTERIOR CERVICAL DECOMP/DISCECTOMY FUSION: SHX1161

## 2014-01-01 SURGERY — ANTERIOR CERVICAL DECOMPRESSION/DISCECTOMY FUSION 2 LEVELS
Anesthesia: General | Site: Neck

## 2014-01-01 MED ORDER — ROCURONIUM BROMIDE 50 MG/5ML IV SOLN
INTRAVENOUS | Status: AC
  Filled 2014-01-01: qty 1

## 2014-01-01 MED ORDER — ACETAMINOPHEN 325 MG PO TABS: 650.0000 mg | ORAL_TABLET | ORAL | Status: DC | PRN

## 2014-01-01 MED ORDER — LIDOCAINE HCL (CARDIAC) 20 MG/ML IV SOLN
INTRAVENOUS | Status: DC | PRN
  Administered 2014-01-01: 100 mg via INTRAVENOUS

## 2014-01-01 MED ORDER — GLYCOPYRROLATE 0.2 MG/ML IJ SOLN
INTRAMUSCULAR | Status: DC | PRN
  Administered 2014-01-01: .6 mg via INTRAVENOUS

## 2014-01-01 MED ORDER — OXYCODONE HCL 5 MG PO TABS
5.0000 mg | ORAL_TABLET | Freq: Once | ORAL | Status: AC | PRN
  Administered 2014-01-01: 5 mg via ORAL

## 2014-01-01 MED ORDER — LORATADINE 10 MG PO TABS
10.0000 mg | ORAL_TABLET | Freq: Every day | ORAL | Status: DC
  Administered 2014-01-02: 10 mg via ORAL
  Filled 2014-01-01: qty 1

## 2014-01-01 MED ORDER — ONDANSETRON HCL 4 MG/2ML IJ SOLN: 4.0000 mg | Freq: Once | INTRAMUSCULAR | Status: DC | PRN

## 2014-01-01 MED ORDER — METHOCARBAMOL 500 MG PO TABS
500.0000 mg | ORAL_TABLET | Freq: Four times a day (QID) | ORAL | Status: DC | PRN
  Administered 2014-01-01 – 2014-01-02 (×3): 500 mg via ORAL
  Filled 2014-01-01 (×2): qty 1

## 2014-01-01 MED ORDER — METHOCARBAMOL 500 MG PO TABS: 500.0000 mg | ORAL_TABLET | Freq: Four times a day (QID) | ORAL | Status: DC | PRN

## 2014-01-01 MED ORDER — LACTATED RINGERS IV SOLN
INTRAVENOUS | Status: DC | PRN
  Administered 2014-01-01 (×2): via INTRAVENOUS

## 2014-01-01 MED ORDER — FENTANYL CITRATE 0.05 MG/ML IJ SOLN
INTRAMUSCULAR | Status: DC | PRN
Start: 2014-01-01 — End: 2014-01-01
  Administered 2014-01-01: 50 ug via INTRAVENOUS
  Administered 2014-01-01: 200 ug via INTRAVENOUS
  Administered 2014-01-01: 50 ug via INTRAVENOUS

## 2014-01-01 MED ORDER — OXYCODONE HCL 5 MG PO TABS
ORAL_TABLET | ORAL | Status: AC
Start: 2014-01-01 — End: 2014-01-01
  Administered 2014-01-01: 13:00:00
  Filled 2014-01-01: qty 1

## 2014-01-01 MED ORDER — BISACODYL 10 MG RE SUPP: 10.0000 mg | Freq: Every day | RECTAL | Status: DC | PRN

## 2014-01-01 MED ORDER — OXYCODONE-ACETAMINOPHEN 5-325 MG PO TABS: 1.0000 | ORAL_TABLET | ORAL | Status: DC | PRN

## 2014-01-01 MED ORDER — GUAIFENESIN ER 600 MG PO TB12
600.0000 mg | ORAL_TABLET | Freq: Two times a day (BID) | ORAL | Status: DC
  Administered 2014-01-01 – 2014-01-02 (×2): 600 mg via ORAL
  Filled 2014-01-01 (×3): qty 1

## 2014-01-01 MED ORDER — SENNOSIDES-DOCUSATE SODIUM 8.6-50 MG PO TABS: 1.0000 | ORAL_TABLET | Freq: Every evening | ORAL | Status: DC | PRN

## 2014-01-01 MED ORDER — HYDROMORPHONE HCL PF 1 MG/ML IJ SOLN
0.5000 mg | INTRAMUSCULAR | Status: DC | PRN
  Administered 2014-01-01: 0.5 mg via INTRAVENOUS

## 2014-01-01 MED ORDER — KETOROLAC TROMETHAMINE 30 MG/ML IJ SOLN
INTRAMUSCULAR | Status: AC
  Filled 2014-01-01: qty 1

## 2014-01-01 MED ORDER — LACTATED RINGERS IV SOLN
INTRAVENOUS | Status: DC
  Administered 2014-01-01: 08:00:00 via INTRAVENOUS

## 2014-01-01 MED ORDER — OXYCODONE-ACETAMINOPHEN 5-325 MG PO TABS
1.0000 | ORAL_TABLET | ORAL | Status: DC | PRN
  Administered 2014-01-01: 2 via ORAL
  Administered 2014-01-02: 1 via ORAL
  Administered 2014-01-02: 2 via ORAL
  Filled 2014-01-01: qty 1
  Filled 2014-01-01 (×2): qty 2

## 2014-01-01 MED ORDER — HYDROMORPHONE HCL PF 1 MG/ML IJ SOLN
INTRAMUSCULAR | Status: AC
Start: 2014-01-01 — End: 2014-01-01
  Administered 2014-01-01: 13:00:00
  Filled 2014-01-01: qty 1

## 2014-01-01 MED ORDER — KETOROLAC TROMETHAMINE 30 MG/ML IJ SOLN
30.0000 mg | Freq: Once | INTRAMUSCULAR | Status: AC
  Administered 2014-01-01: 30 mg via INTRAVENOUS

## 2014-01-01 MED ORDER — SODIUM CHLORIDE 0.9 % IV SOLN: 250.0000 mL | INTRAVENOUS | Status: DC

## 2014-01-01 MED ORDER — PROPOFOL 10 MG/ML IV BOLUS
INTRAVENOUS | Status: AC
  Filled 2014-01-01: qty 20

## 2014-01-01 MED ORDER — ROCURONIUM BROMIDE 100 MG/10ML IV SOLN
INTRAVENOUS | Status: DC | PRN
  Administered 2014-01-01: 50 mg via INTRAVENOUS

## 2014-01-01 MED ORDER — ONDANSETRON HCL 4 MG/2ML IJ SOLN
4.0000 mg | INTRAMUSCULAR | Status: DC | PRN
  Administered 2014-01-01: 4 mg via INTRAVENOUS
  Filled 2014-01-01: qty 2

## 2014-01-01 MED ORDER — HYDROCODONE-ACETAMINOPHEN 5-325 MG PO TABS
1.0000 | ORAL_TABLET | ORAL | Status: DC | PRN
  Administered 2014-01-01 (×2): 2 via ORAL
  Administered 2014-01-02: 1 via ORAL
  Administered 2014-01-02: 2 via ORAL
  Filled 2014-01-01: qty 1
  Filled 2014-01-01 (×3): qty 2

## 2014-01-01 MED ORDER — MIDAZOLAM HCL 2 MG/2ML IJ SOLN
1.0000 mg | Freq: Once | INTRAMUSCULAR | Status: AC
  Administered 2014-01-01: 1 mg via INTRAVENOUS

## 2014-01-01 MED ORDER — DIPHENHYDRAMINE HCL 25 MG PO CAPS: 25.0000 mg | ORAL_CAPSULE | Freq: Every evening | ORAL | Status: DC | PRN

## 2014-01-01 MED ORDER — MIDAZOLAM HCL 2 MG/2ML IJ SOLN
INTRAMUSCULAR | Status: AC
  Filled 2014-01-01: qty 2

## 2014-01-01 MED ORDER — LEVOCETIRIZINE DIHYDROCHLORIDE 5 MG PO TABS: 5.0000 mg | ORAL_TABLET | Freq: Every morning | ORAL | Status: DC

## 2014-01-01 MED ORDER — FLEET ENEMA 7-19 GM/118ML RE ENEM
1.0000 | ENEMA | Freq: Once | RECTAL | Status: AC | PRN
Start: 2014-01-01 — End: 2014-01-01

## 2014-01-01 MED ORDER — EPHEDRINE SULFATE 50 MG/ML IJ SOLN
INTRAMUSCULAR | Status: AC
  Filled 2014-01-01: qty 1

## 2014-01-01 MED ORDER — SODIUM CHLORIDE 0.9 % IJ SOLN: 3.0000 mL | Freq: Two times a day (BID) | INTRAMUSCULAR | Status: DC

## 2014-01-01 MED ORDER — PROMETHAZINE HCL 25 MG/ML IJ SOLN
12.5000 mg | Freq: Four times a day (QID) | INTRAMUSCULAR | Status: DC | PRN
Start: 2014-01-01 — End: 2014-01-02
  Administered 2014-01-01: 12.5 mg via INTRAVENOUS
  Filled 2014-01-01: qty 1

## 2014-01-01 MED ORDER — PANTOPRAZOLE SODIUM 40 MG IV SOLR
40.0000 mg | Freq: Every day | INTRAVENOUS | Status: DC
  Administered 2014-01-01: 40 mg via INTRAVENOUS
  Filled 2014-01-01 (×2): qty 40

## 2014-01-01 MED ORDER — HYDROMORPHONE HCL PF 1 MG/ML IJ SOLN
INTRAMUSCULAR | Status: AC
Start: 2014-01-01 — End: 2014-01-01
  Administered 2014-01-01: 12:00:00
  Filled 2014-01-01: qty 1

## 2014-01-01 MED ORDER — OXYCODONE HCL 5 MG/5ML PO SOLN: 5.0000 mg | Freq: Once | ORAL | Status: AC | PRN

## 2014-01-01 MED ORDER — METHOCARBAMOL 500 MG PO TABS
ORAL_TABLET | ORAL | Status: AC
Start: 2014-01-01 — End: 2014-01-01
  Administered 2014-01-01: 13:00:00
  Filled 2014-01-01: qty 1

## 2014-01-01 MED ORDER — STERILE WATER FOR INJECTION IJ SOLN
INTRAMUSCULAR | Status: AC
  Filled 2014-01-01: qty 10

## 2014-01-01 MED ORDER — KETOROLAC TROMETHAMINE 30 MG/ML IJ SOLN: 30.0000 mg | Freq: Once | INTRAMUSCULAR | Status: DC

## 2014-01-01 MED ORDER — MIDAZOLAM HCL 2 MG/2ML IJ SOLN
INTRAMUSCULAR | Status: AC
  Administered 2014-01-01: 1 mg
  Filled 2014-01-01: qty 2

## 2014-01-01 MED ORDER — NEOSTIGMINE METHYLSULFATE 1 MG/ML IJ SOLN
INTRAMUSCULAR | Status: DC | PRN
  Administered 2014-01-01: 4 mg via INTRAVENOUS

## 2014-01-01 MED ORDER — PROPOFOL 10 MG/ML IV BOLUS
INTRAVENOUS | Status: DC | PRN
  Administered 2014-01-01: 120 mg via INTRAVENOUS

## 2014-01-01 MED ORDER — 0.9 % SODIUM CHLORIDE (POUR BTL) OPTIME
TOPICAL | Status: DC | PRN
  Administered 2014-01-01: 1000 mL

## 2014-01-01 MED ORDER — ARTIFICIAL TEARS OP OINT
TOPICAL_OINTMENT | OPHTHALMIC | Status: DC | PRN
  Administered 2014-01-01: 1 via OPHTHALMIC

## 2014-01-01 MED ORDER — METHOCARBAMOL 100 MG/ML IJ SOLN: 500.0000 mg | Freq: Three times a day (TID) | INTRAVENOUS | Status: DC

## 2014-01-01 MED ORDER — ACETAMINOPHEN 650 MG RE SUPP: 650.0000 mg | RECTAL | Status: DC | PRN

## 2014-01-01 MED ORDER — MEPERIDINE HCL 25 MG/ML IJ SOLN
6.2500 mg | INTRAMUSCULAR | Status: DC | PRN
Start: 2014-01-01 — End: 2014-01-01

## 2014-01-01 MED ORDER — MENTHOL 3 MG MT LOZG: 1.0000 | LOZENGE | OROMUCOSAL | Status: DC | PRN

## 2014-01-01 MED ORDER — HYDROMORPHONE HCL PF 1 MG/ML IJ SOLN
0.2500 mg | INTRAMUSCULAR | Status: DC | PRN
  Administered 2014-01-01 (×4): 0.5 mg via INTRAVENOUS

## 2014-01-01 MED ORDER — LIDOCAINE HCL 4 % MT SOLN
OROMUCOSAL | Status: DC | PRN
  Administered 2014-01-01: 4 mL via TOPICAL

## 2014-01-01 MED ORDER — ONDANSETRON HCL 4 MG/2ML IJ SOLN
INTRAMUSCULAR | Status: AC
  Filled 2014-01-01: qty 2

## 2014-01-01 MED ORDER — SODIUM CHLORIDE 0.9 % IJ SOLN: 3.0000 mL | INTRAMUSCULAR | Status: DC | PRN

## 2014-01-01 MED ORDER — MIDAZOLAM HCL 5 MG/5ML IJ SOLN
INTRAMUSCULAR | Status: DC | PRN
  Administered 2014-01-01: 2 mg via INTRAVENOUS

## 2014-01-01 MED ORDER — KCL IN DEXTROSE-NACL 20-5-0.45 MEQ/L-%-% IV SOLN
INTRAVENOUS | Status: DC
  Administered 2014-01-01: 75 mL/h via INTRAVENOUS
  Filled 2014-01-01 (×3): qty 1000

## 2014-01-01 MED ORDER — PSEUDOEPHEDRINE-GUAIFENESIN ER 60-600 MG PO TB12: 1.0000 | ORAL_TABLET | Freq: Every day | ORAL | Status: DC | PRN

## 2014-01-01 MED ORDER — FENTANYL CITRATE 0.05 MG/ML IJ SOLN
INTRAMUSCULAR | Status: AC
  Filled 2014-01-01: qty 5

## 2014-01-01 MED ORDER — EPHEDRINE SULFATE 50 MG/ML IJ SOLN
INTRAMUSCULAR | Status: DC | PRN
  Administered 2014-01-01: 10 mg via INTRAVENOUS
  Administered 2014-01-01: 5 mg via INTRAVENOUS
  Administered 2014-01-01: 10 mg via INTRAVENOUS

## 2014-01-01 MED ORDER — NICOTINE 10 MG IN INHA: 1.0000 | Freq: Every day | RESPIRATORY_TRACT | Status: DC | PRN

## 2014-01-01 MED ORDER — BUPIVACAINE-EPINEPHRINE 0.25% -1:200000 IJ SOLN
INTRAMUSCULAR | Status: DC | PRN
  Administered 2014-01-01: 10 mL

## 2014-01-01 MED ORDER — METHOCARBAMOL 100 MG/ML IJ SOLN
500.0000 mg | Freq: Four times a day (QID) | INTRAVENOUS | Status: DC | PRN
  Filled 2014-01-01: qty 5

## 2014-01-01 MED ORDER — MORPHINE SULFATE 2 MG/ML IJ SOLN
1.0000 mg | INTRAMUSCULAR | Status: DC | PRN
  Administered 2014-01-02: 2 mg via INTRAVENOUS
  Filled 2014-01-01: qty 1

## 2014-01-01 MED ORDER — DOCUSATE SODIUM 100 MG PO CAPS
100.0000 mg | ORAL_CAPSULE | Freq: Two times a day (BID) | ORAL | Status: DC
  Administered 2014-01-01 – 2014-01-02 (×2): 100 mg via ORAL
  Filled 2014-01-01 (×2): qty 1

## 2014-01-01 MED ORDER — METOCLOPRAMIDE HCL 5 MG/ML IJ SOLN
10.0000 mg | Freq: Four times a day (QID) | INTRAMUSCULAR | Status: DC | PRN
  Administered 2014-01-01: 10 mg via INTRAVENOUS
  Filled 2014-01-01: qty 2

## 2014-01-01 MED ORDER — LIDOCAINE HCL (CARDIAC) 20 MG/ML IV SOLN
INTRAVENOUS | Status: AC
  Filled 2014-01-01: qty 10

## 2014-01-01 MED ORDER — MONTELUKAST SODIUM 10 MG PO TABS
10.0000 mg | ORAL_TABLET | Freq: Every morning | ORAL | Status: DC
  Administered 2014-01-02: 10 mg via ORAL
  Filled 2014-01-01: qty 1

## 2014-01-01 MED ORDER — PSEUDOEPHEDRINE HCL 30 MG PO TABS
30.0000 mg | ORAL_TABLET | Freq: Four times a day (QID) | ORAL | Status: DC
  Administered 2014-01-01 – 2014-01-02 (×3): 30 mg via ORAL
  Filled 2014-01-01 (×12): qty 1

## 2014-01-01 MED ORDER — ONDANSETRON HCL 4 MG/2ML IJ SOLN
INTRAMUSCULAR | Status: DC | PRN
  Administered 2014-01-01: 4 mg via INTRAVENOUS

## 2014-01-01 MED ORDER — BUPIVACAINE-EPINEPHRINE (PF) 0.25% -1:200000 IJ SOLN
INTRAMUSCULAR | Status: AC
  Filled 2014-01-01: qty 30

## 2014-01-01 MED ORDER — PHENOL 1.4 % MT LIQD: 1.0000 | OROMUCOSAL | Status: DC | PRN

## 2014-01-01 MED ORDER — GLYCOPYRROLATE 0.2 MG/ML IJ SOLN
INTRAMUSCULAR | Status: AC
  Filled 2014-01-01: qty 3

## 2014-01-01 MED ORDER — NEOSTIGMINE METHYLSULFATE 1 MG/ML IJ SOLN
INTRAMUSCULAR | Status: AC
  Filled 2014-01-01: qty 10

## 2014-01-01 SURGICAL SUPPLY — 56 items
BENZOIN TINCTURE PRP APPL 2/3 (GAUZE/BANDAGES/DRESSINGS) ×2 IMPLANT
BIT DRILL SKYLINE 12MM (BIT) ×1 IMPLANT
BLADE SURG ROTATE 9660 (MISCELLANEOUS) IMPLANT
BONE CERV LORDOTIC 14.5X12X6 (Bone Implant) ×4 IMPLANT
BUR ROUND FLUTED 4 SOFT TCH (BURR) ×2 IMPLANT
CANISTER SUCTION 2500CC (MISCELLANEOUS) ×2 IMPLANT
CLSR STERI-STRIP ANTIMIC 1/2X4 (GAUZE/BANDAGES/DRESSINGS) ×2 IMPLANT
COLLAR CERV LO CONTOUR FIRM DE (SOFTGOODS) ×2 IMPLANT
CORDS BIPOLAR (ELECTRODE) IMPLANT
COVER SURGICAL LIGHT HANDLE (MISCELLANEOUS) ×2 IMPLANT
DERMABOND ADVANCED (GAUZE/BANDAGES/DRESSINGS) ×1
DERMABOND ADVANCED .7 DNX12 (GAUZE/BANDAGES/DRESSINGS) ×1 IMPLANT
DRAPE C-ARM 42X72 X-RAY (DRAPES) ×2 IMPLANT
DRAPE MICROSCOPE LEICA (MISCELLANEOUS) ×2 IMPLANT
DRAPE PROXIMA HALF (DRAPES) ×4 IMPLANT
DRILL BIT SKYLINE 12MM (BIT) ×1
DRSG MEPILEX BORDER 4X4 (GAUZE/BANDAGES/DRESSINGS) ×2 IMPLANT
DRSG MEPILEX BORDER 4X8 (GAUZE/BANDAGES/DRESSINGS) ×2 IMPLANT
DURAPREP 6ML APPLICATOR 50/CS (WOUND CARE) ×2 IMPLANT
ELECT COATED BLADE 2.86 ST (ELECTRODE) ×2 IMPLANT
ELECT REM PT RETURN 9FT ADLT (ELECTROSURGICAL) ×2
ELECTRODE REM PT RTRN 9FT ADLT (ELECTROSURGICAL) ×1 IMPLANT
EVACUATOR 1/8 PVC DRAIN (DRAIN) IMPLANT
GAUZE XEROFORM 1X8 LF (GAUZE/BANDAGES/DRESSINGS) ×2 IMPLANT
GLOVE BIOGEL PI IND STRL 7.5 (GLOVE) ×1 IMPLANT
GLOVE BIOGEL PI IND STRL 8 (GLOVE) ×1 IMPLANT
GLOVE BIOGEL PI INDICATOR 7.5 (GLOVE) ×1
GLOVE BIOGEL PI INDICATOR 8 (GLOVE) ×1
GLOVE ECLIPSE 7.0 STRL STRAW (GLOVE) ×2 IMPLANT
GLOVE ORTHO TXT STRL SZ7.5 (GLOVE) ×2 IMPLANT
GOWN STRL REUS W/ TWL LRG LVL3 (GOWN DISPOSABLE) ×2 IMPLANT
GOWN STRL REUS W/ TWL XL LVL3 (GOWN DISPOSABLE) ×1 IMPLANT
GOWN STRL REUS W/TWL LRG LVL3 (GOWN DISPOSABLE) ×2
GOWN STRL REUS W/TWL XL LVL3 (GOWN DISPOSABLE) ×1
HEAD HALTER (SOFTGOODS) ×2 IMPLANT
HEMOSTAT SURGICEL 2X14 (HEMOSTASIS) IMPLANT
KIT BASIN OR (CUSTOM PROCEDURE TRAY) ×2 IMPLANT
KIT ROOM TURNOVER OR (KITS) ×2 IMPLANT
MANIFOLD NEPTUNE II (INSTRUMENTS) IMPLANT
NEEDLE 25GX 5/8IN NON SAFETY (NEEDLE) ×2 IMPLANT
NS IRRIG 1000ML POUR BTL (IV SOLUTION) ×2 IMPLANT
PACK ORTHO CERVICAL (CUSTOM PROCEDURE TRAY) ×2 IMPLANT
PAD ARMBOARD 7.5X6 YLW CONV (MISCELLANEOUS) ×4 IMPLANT
PATTIES SURGICAL .5 X.5 (GAUZE/BANDAGES/DRESSINGS) IMPLANT
PIN TEMP SKYLINE THREADED (PIN) ×2 IMPLANT
PLATE TWO LEVEL SKYLINE 30MM (Plate) ×2 IMPLANT
SCREW VARIABLE SELF TAP 12MM (Screw) ×12 IMPLANT
SPONGE GAUZE 4X4 12PLY (GAUZE/BANDAGES/DRESSINGS) ×2 IMPLANT
SURGIFLO TRUKIT (HEMOSTASIS) IMPLANT
SUT VIC AB 3-0 X1 27 (SUTURE) ×2 IMPLANT
SUT VICRYL 4-0 PS2 18IN ABS (SUTURE) ×4 IMPLANT
SYR 30ML SLIP (SYRINGE) ×2 IMPLANT
TAPE CLOTH SURG 6X10 WHT LF (GAUZE/BANDAGES/DRESSINGS) ×2 IMPLANT
TOWEL OR 17X24 6PK STRL BLUE (TOWEL DISPOSABLE) ×2 IMPLANT
TOWEL OR 17X26 10 PK STRL BLUE (TOWEL DISPOSABLE) ×2 IMPLANT
WATER STERILE IRR 1000ML POUR (IV SOLUTION) IMPLANT

## 2014-01-01 NOTE — Brief Op Note (Cosign Needed)
01/01/2014  11:39 AM  PATIENT:  Stephanie Deleon  53 y.o. female  PRE-OPERATIVE DIAGNOSIS:  C4-5, C5-6 Cervical Spondylosis, Protrusion  POST-OPERATIVE DIAGNOSIS:  C4-5, C5-6 Cervical Spondylosis, Protrusion  PROCEDURE:  Procedure(s) with comments: ANTERIOR CERVICAL DECOMPRESSION/DISCECTOMY FUSION 2 LEVELS (N/A) - C4-5, C5-6 Anterior Cervical Discectomy, Fusion, Allograft, Plate and Screws  SURGEON:  Surgeon(s) and Role:    * Marybelle Killings, MD - Primary  PHYSICIAN ASSISTANT: [facility]  ASSISTANTS: none   ANESTHESIA:   general  EBL:  Total I/O In: 1600 [I.V.:1600] Out: 32 [Blood:50]  BLOOD ADMINISTERED:none  DRAINS: (1) Hemovact drain(s) in the anterior neck with  Suction Open   LOCAL MEDICATIONS USED:  MARCAINE     SPECIMEN:  No Specimen  DISPOSITION OF SPECIMEN:  N/A  COUNTS:  YES  TOURNIQUET:  * No tourniquets in log *  DICTATION: .Note written in EPIC  PLAN OF CARE: Admit to inpatient   PATIENT DISPOSITION:  PACU - hemodynamically stable.   Delay start of Pharmacological VTE agent (>24hrs) due to surgical blood loss or risk of bleeding: yes

## 2014-01-01 NOTE — Anesthesia Preprocedure Evaluation (Addendum)
Anesthesia Evaluation  Patient identified by MRN, date of birth, ID band Patient awake    Reviewed: Allergy & Precautions, H&P , NPO status , Patient's Chart, lab work & pertinent test results  Airway Mallampati: I TM Distance: >3 FB Neck ROM: Full    Dental  (+) Dental Advisory Given   Pulmonary Current Smoker,          Cardiovascular     Neuro/Psych  Headaches, PSYCHIATRIC DISORDERS Anxiety Depression    GI/Hepatic GERD-  Medicated,  Endo/Other  Morbid obesity  Renal/GU      Musculoskeletal   Abdominal   Peds  Hematology   Anesthesia Other Findings   Reproductive/Obstetrics                          Anesthesia Physical Anesthesia Plan  ASA: II  Anesthesia Plan: General   Post-op Pain Management:    Induction: Intravenous  Airway Management Planned: Oral ETT  Additional Equipment:   Intra-op Plan:   Post-operative Plan: Extubation in OR  Informed Consent: I have reviewed the patients History and Physical, chart, labs and discussed the procedure including the risks, benefits and alternatives for the proposed anesthesia with the patient or authorized representative who has indicated his/her understanding and acceptance.   Dental advisory given  Plan Discussed with: CRNA and Surgeon  Anesthesia Plan Comments:        Anesthesia Quick Evaluation

## 2014-01-01 NOTE — Plan of Care (Signed)
Problem: Consults Goal: Diagnosis - Spinal Surgery Cervical Spine Fusion     

## 2014-01-01 NOTE — Discharge Instructions (Signed)
No lifting greater than 10 lbs. No overhead use of arms. °Avoid bending,and twisting neck. °Walk in house for first week them may start to get out slowly increasing distance up to one mile by 3 weeks post op. °Keep incision dry for 3 days, may then bathe and wet incision using a covered collar when showering. °Call if any fevers >101, chills, or increasing numbness or weakness or increased swelling or drainage. ° °

## 2014-01-01 NOTE — Interval H&P Note (Signed)
History and Physical Interval Note:  01/01/2014 8:58 AM  Stephanie Deleon  has presented today for surgery, with the diagnosis of C4-5, C5-6 Cervical Spondylosis, Protrusion  The various methods of treatment have been discussed with the patient and family. After consideration of risks, benefits and other options for treatment, the patient has consented to  Procedure(s) with comments: ANTERIOR CERVICAL DECOMPRESSION/DISCECTOMY FUSION 2 LEVELS (N/A) - C4-5, C5-6 Anterior Cervical Discectomy and Fusion, Allograft, Plate as a surgical intervention .  The patient's history has been reviewed, patient examined, no change in status, stable for surgery.  I have reviewed the patient's chart and labs.  Questions were answered to the patient's satisfaction.     [name] C

## 2014-01-01 NOTE — Preoperative (Signed)
Beta Blockers   Reason not to administer Beta Blockers:Not Applicable 

## 2014-01-01 NOTE — Anesthesia Postprocedure Evaluation (Signed)
Anesthesia Post Note  Patient: Stephanie Deleon  Procedure(s) Performed: Procedure(s) (LRB): ANTERIOR CERVICAL DECOMPRESSION/DISCECTOMY FUSION 2 LEVELS (N/A)  Anesthesia type: general  Patient location: PACU  Post pain: Pain level controlled  Post assessment: Patient's Cardiovascular Status Stable  Last Vitals:  Filed Vitals:   01/01/14 1410  BP: 88/60  Pulse:   Temp: 36.1 C  Resp: 16    Post vital signs: Reviewed and stable  Level of consciousness: sedated  Complications: No apparent anesthesia complications

## 2014-01-01 NOTE — Transfer of Care (Signed)
Immediate Anesthesia Transfer of Care Note  Patient: Stephanie Deleon  Procedure(s) Performed: Procedure(s) with comments: ANTERIOR CERVICAL DECOMPRESSION/DISCECTOMY FUSION 2 LEVELS (N/A) - C4-5, C5-6 Anterior Cervical Discectomy, Fusion, Allograft, Plate and Screws  Patient Location: PACU  Anesthesia Type:General  Level of Consciousness: awake, alert  and oriented  Airway & Oxygen Therapy: Patient Spontanous Breathing and Patient connected to nasal cannula oxygen  Post-op Assessment: Report given to PACU RN, Post -op Vital signs reviewed and stable and Patient moving all extremities X 4  Post vital signs: Reviewed and stable  Complications: No apparent anesthesia complications

## 2014-01-01 NOTE — Progress Notes (Signed)
Utilization review completed.  

## 2014-01-02 MED ORDER — PANTOPRAZOLE SODIUM 40 MG PO TBEC: 40.0000 mg | DELAYED_RELEASE_TABLET | Freq: Every day | ORAL | Status: DC

## 2014-01-02 NOTE — Progress Notes (Signed)
Discharge instructions and prescriptions given to and reviewed with patient. Patient denies questions or concerns. Dressing changed with no complications and IV removed. VS stable. Patient discharged via wheelchair with personal belongings, discharge packet, and prescriptions.

## 2014-01-02 NOTE — Op Note (Signed)
NAMEMarland Kitchen  Stephanie, Deleon NO.:  [id number]  MEDICAL RECORD NO.:  [id number]  LOCATION:  [id number]                        FACILITY:  [facility]  PHYSICIAN:  Javarion Douty C. Lorin Mercy, M.D.    DATE OF BIRTH:  Feb 24, 1961  DATE OF PROCEDURE:  01/01/2014 DATE OF DISCHARGE:                              OPERATIVE REPORT   PREOPERATIVE DIAGNOSIS:  Cervical spondylosis, C4-5 and C5-6.  POSTOPERATIVE DIAGNOSIS:  Cervical spondylosis, C4-5 and C5-6.  PROCEDURE:  C4-5, C5-6 anterior cervical diskectomy and fusion, allograft and plate.  SURGEON:  Ellen Goris C. Lorin Mercy, M.D.  ASSISTANT:  Epimenio Foot, PA-C, medically necessary and present for the entire procedure.  ESTIMATED BLOOD LOSS:  Minimal.  ANESTHESIA:  General plus 7 mL of Marcaine, skin local.  FINDINGS:  Spondylosis, disk protrusion, C5-6.  Left eccentric.  Similar findings at C4-5 with less spondylosis.  OPERATIVE PROCEDURE:  After induction of general anesthesia, orotracheal intubation, and head halter traction, arms were tucked at the side with yellow foam pads.  Neck was prepped with DuraPrep.  The usual area was squared with towels, sterile skin marker, 1/2 of a Betadine Steri-Drape, sterile Mayo stand at the head and thyroid sheets and drapes, half sheet at the top.  Time-out procedure was completed.  Incision was started at the midline, extended to the left, in line with the prominent skin fold over the operative levels based on palpable landmarks.  Platysma was divided in line with the skin incision.  Blunt dissection down with the carotid sheath and contents lateral, down to the level of longus coli, prominent spur at C5-6.  Short 25 needle was placed on a straight clamp. C-arm was sterilely draped, brought in, and lateral film confirmed that this was a C5-6 level.  Cloward self-retaining teeth blades were placed right and left, smooth blades up and down.  Spur was removed anteriorly. Subperiosteal dissection anteriorly was  performed.  Spurs were removed. A 4-mm burr was used.  Disk space at C5-6 was narrowed.  There was disk protrusion and spurs present, primarily more on the left than right side corresponding with her symptoms.  1-2 mm Kerrisons were used to remove the spurs, completely removed the posterior longitudinal ligament, decompressed the dura, uncovertebral joint stripping and then trial sizers.  A 6-mm Sizer was appropriate and restored the disk space height.  A 6-mm cortical cancellous graft was selected, marked in the midline.  Head halter traction was pulled by CRNA, with the graft was inserted, countersunk 1-2 mm.  Identical procedure was then repeated at 4-5.  At this level, the disk was bulging, had a taller disk space height.  There was more disk protrusion on the left side, soft disk component into the left lateral aspect causing compression.  Posterior longitudinal ligament was taken down completely exposing the dura all the way across.  Trial sizer showed 6 gave a nice fit.  Operative microscope was used at this level as well.  Graft was marked anteriorly at the midline, inserted countersunk and then a 30-mm Synthes/DePuy plate was selected.  The 12-mm screws were used x6.  Fluoroscopy was checked position.  Grafts, plate, screw were in good position. Operative field was dry.  Finger tip was introduced anteriorly over the lower cervical region for any egress of fluid down in the mediastinum. Operative field was dry.  Hemovac was placed with in-and-out technique, in line with the skin incision on the left side.  Platysma was closed with 3-0 Vicryl, 4-0 Vicryl subcuticular closure.  Tincture of benzoin, Steri- Strips, Marcaine infiltration, 4x4s, tape and soft cervical collar.     Elvyn Krohn C. Lorin Mercy, M.D.     MCY/MEDQ  D:  01/01/2014  T:  01/02/2014  Job:  [id number]

## 2014-01-05 ENCOUNTER — Encounter (HOSPITAL_COMMUNITY): Payer: Self-pay | Admitting: Orthopaedic Surgery

## 2014-01-11 NOTE — Discharge Summary (Signed)
Physician Discharge Summary  Patient ID: Stephanie Deleon MRN: [id number] DOB/AGE: May 09, 1961 53 y.o.  Admit date: 01/01/2014 Discharge date: 01/02/2014  Admission Diagnoses:  Cervical spondylosis without myelopathy C4-5 and C5-6  Discharge Diagnoses:  Principal Problem:   Cervical spondylosis without myelopathy Active Problems:   Cervical spondylosis   Past Medical History  Diagnosis Date  . Family history of anesthesia complication     mother N/V  . Cold 12/2013    cough  . Anxiety   . Headache(784.0)     due to neck    Surgeries: Procedure(s): ANTERIOR CERVICAL DECOMPRESSION/DISCECTOMY FUSION 2 LEVELS C4-5 and C5-6 on 01/01/2014   Consultants (if any):    Discharged Condition: Improved  Hospital Course: Stephanie Deleon is an 53 y.o. female who was admitted 01/01/2014 with a diagnosis of Cervical spondylosis without myelopathy and went to the operating room on 01/01/2014 and underwent the above named procedures.    She was given perioperative antibiotics:  Anti-infectives   Start     Dose/Rate Route Frequency Ordered Stop   01/01/14 0600  ceFAZolin (ANCEF) IVPB 2 g/50 mL premix     2 g 100 mL/hr over 30 Minutes Intravenous On call to O.R. 12/31/13 1400 01/01/14 0959    .  She was given sequential compression devices, early ambulation for DVT prophylaxis.  She benefited maximally from the hospital stay and there were no complications.    Recent vital signs:  Filed Vitals:   01/02/14 0516  BP: 94/49  Pulse: 62  Temp: 97.8 F (36.6 C)  Resp: 18    Recent laboratory studies:  Lab Results  Component Value Date   HGB 13.9 12/30/2013   Lab Results  Component Value Date   WBC 5.6 12/30/2013   PLT 227 12/30/2013   Lab Results  Component Value Date   INR 0.90 12/30/2013   Lab Results  Component Value Date   NA 141 12/30/2013   K 4.1 12/30/2013   CL 100 12/30/2013   CO2 28 12/30/2013   BUN 15 12/30/2013   CREATININE 0.92 12/30/2013   GLUCOSE 83 12/30/2013     Discharge Medications:     Medication List    STOP taking these medications       acetaminophen-codeine 300-30 MG per tablet  Commonly known as:  TYLENOL #3     diclofenac 75 MG EC tablet  Commonly known as:  VOLTAREN      TAKE these medications       diphenhydrAMINE 25 mg capsule  Commonly known as:  BENADRYL  Take 25 mg by mouth at bedtime as needed for allergies.     levocetirizine 5 MG tablet  Commonly known as:  XYZAL  Take 5 mg by mouth every morning.     methocarbamol 500 MG tablet  Commonly known as:  ROBAXIN  Take 1 tablet (500 mg total) by mouth every 6 (six) hours as needed for muscle spasms (spasm).     montelukast 10 MG tablet  Commonly known as:  SINGULAIR  Take 10 mg by mouth every morning.     nicotine 10 MG inhaler  Commonly known as:  NICOTROL  Inhale 1 continuous puffing into the lungs daily as needed for smoking cessation.     oxyCODONE-acetaminophen 5-325 MG per tablet  Commonly known as:  ROXICET  Take 1-2 tablets by mouth every 4 (four) hours as needed.     pseudoephedrine-guaifenesin 60-600 MG per tablet  Commonly known as:  MUCINEX D  Take 1  tablet by mouth daily as needed for congestion.        Diagnostic Studies: Dg Chest 2 View  12/30/2013   CLINICAL DATA:  Cold, congestion, preop for cervical spondylosis  EXAM: CHEST  2 VIEW  COMPARISON:  None  FINDINGS: Cardiomediastinal silhouette is unremarkable. No acute infiltrate or pleural effusion. Osteopenia and mild degenerative changes lower thoracic spine.  IMPRESSION: No active cardiopulmonary disease.   Electronically Signed   By: Lahoma Crocker M.D.   On: 12/30/2013 14:52   Dg Cervical Spine 2-3 Views  01/01/2014   CLINICAL DATA:  ACDF  EXAM: CERVICAL SPINE - 2-3 VIEW  COMPARISON:  12/08/2013  FINDINGS: Two C-arm images show a anterior cervical discectomy and fusion from C4-C6. Interbody fusion material appears well positioned. Anterior plate and screws appear well positioned.   IMPRESSION: Good appearance following ACDF C4-C6.   Electronically Signed   By: Nelson Chimes M.D.   On: 01/01/2014 12:49    Disposition: 01-Home or Self Care      Discharge Orders   Future Orders Complete By Expires   Call MD / Call 911  As directed    Comments:     If you experience chest pain or shortness of breath, CALL 911 and be transported to the hospital emergency room.  If you develope a fever above 101 F, pus (white drainage) or increased drainage or redness at the wound, or calf pain, call your surgeon's office.   Constipation Prevention  As directed    Comments:     Drink plenty of fluids.  Prune juice may be helpful.  You may use a stool softener, such as Colace (over the counter) 100 mg twice a day.  Use MiraLax (over the counter) for constipation as needed.   Diet - low sodium heart healthy  As directed    Discharge instructions  As directed    Comments:     No lifting greater than 10 lbs. No overhead use of arms. Avoid bending,and twisting neck. Walk in house for first week them may start to get out slowly increasing distance up to one mile by 3 weeks post op. Keep incision dry for 3 days, may then bathe and wet incision using a covered collar when showering. Call if any fevers >101, chills, or increasing numbness or weakness or increased swelling or drainage.   Driving restrictions  As directed    Comments:     No driving   Increase activity slowly as tolerated  As directed    Lifting restrictions  As directed    Comments:     No lifting      Follow-up Information   Follow up with YATES,MARK C, MD In 1 week.   Specialty:  Orthopedic Surgery   Contact information:   [place] [address] [phone]        Signed: Epimenio Foot 01/11/2014, 3:37 PM

## 2014-05-25 ENCOUNTER — Other Ambulatory Visit: Payer: Self-pay

## 2014-05-25 DIAGNOSIS — Z1231 Encounter for screening mammogram for malignant neoplasm of breast: Secondary | ICD-10-CM

## 2014-06-17 ENCOUNTER — Ambulatory Visit
Admission: RE | Admit: 2014-06-17 | Discharge: 2014-06-17 | Disposition: A | Discharge status: Home / Self Care | Payer: Federal, State, Local not specified - PPO | Source: Ambulatory Visit

## 2014-06-17 DIAGNOSIS — Z1231 Encounter for screening mammogram for malignant neoplasm of breast: Secondary | ICD-10-CM

## 2015-01-12 ENCOUNTER — Encounter: Payer: Self-pay | Admitting: Internal Medicine

## 2015-11-23 ENCOUNTER — Other Ambulatory Visit: Payer: Self-pay

## 2015-11-23 DIAGNOSIS — Z1231 Encounter for screening mammogram for malignant neoplasm of breast: Secondary | ICD-10-CM

## 2015-12-07 ENCOUNTER — Ambulatory Visit
Admission: RE | Admit: 2015-12-07 | Discharge: 2015-12-07 | Disposition: A | Discharge status: Home / Self Care | Payer: Federal, State, Local not specified - PPO | Source: Ambulatory Visit

## 2015-12-07 DIAGNOSIS — Z1231 Encounter for screening mammogram for malignant neoplasm of breast: Secondary | ICD-10-CM

## 2016-04-09 DIAGNOSIS — H6591 Unspecified nonsuppurative otitis media, right ear: Secondary | ICD-10-CM | POA: Diagnosis not present

## 2016-04-09 DIAGNOSIS — J0110 Acute frontal sinusitis, unspecified: Secondary | ICD-10-CM | POA: Diagnosis not present

## 2016-04-09 DIAGNOSIS — R05 Cough: Secondary | ICD-10-CM | POA: Diagnosis not present

## 2016-04-26 DIAGNOSIS — K080 Exfoliation of teeth due to systemic causes: Secondary | ICD-10-CM | POA: Diagnosis not present

## 2016-11-07 DIAGNOSIS — K080 Exfoliation of teeth due to systemic causes: Secondary | ICD-10-CM | POA: Diagnosis not present

## 2017-04-12 DIAGNOSIS — G8929 Other chronic pain: Secondary | ICD-10-CM | POA: Diagnosis not present

## 2017-04-12 DIAGNOSIS — J309 Allergic rhinitis, unspecified: Secondary | ICD-10-CM | POA: Diagnosis not present

## 2017-04-12 DIAGNOSIS — M542 Cervicalgia: Secondary | ICD-10-CM | POA: Diagnosis not present

## 2017-04-12 DIAGNOSIS — M545 Low back pain: Secondary | ICD-10-CM | POA: Diagnosis not present

## 2017-04-17 ENCOUNTER — Other Ambulatory Visit: Payer: Self-pay | Admitting: Family Medicine

## 2017-04-17 DIAGNOSIS — Z1231 Encounter for screening mammogram for malignant neoplasm of breast: Secondary | ICD-10-CM

## 2017-05-13 ENCOUNTER — Ambulatory Visit
Admission: RE | Admit: 2017-05-13 | Discharge: 2017-05-13 | Disposition: A | Discharge status: Home / Self Care | Payer: Federal, State, Local not specified - PPO | Source: Ambulatory Visit | Attending: Family Medicine | Admitting: Family Medicine

## 2017-05-13 DIAGNOSIS — Z1231 Encounter for screening mammogram for malignant neoplasm of breast: Secondary | ICD-10-CM

## 2017-05-30 DIAGNOSIS — K080 Exfoliation of teeth due to systemic causes: Secondary | ICD-10-CM | POA: Diagnosis not present

## 2017-09-01 DIAGNOSIS — M791 Myalgia, unspecified site: Secondary | ICD-10-CM | POA: Diagnosis not present

## 2017-09-01 DIAGNOSIS — R509 Fever, unspecified: Secondary | ICD-10-CM | POA: Diagnosis not present

## 2017-09-01 DIAGNOSIS — R05 Cough: Secondary | ICD-10-CM | POA: Diagnosis not present

## 2017-09-01 DIAGNOSIS — R51 Headache: Secondary | ICD-10-CM | POA: Diagnosis not present

## 2017-09-13 DIAGNOSIS — F419 Anxiety disorder, unspecified: Secondary | ICD-10-CM | POA: Diagnosis not present

## 2017-09-13 DIAGNOSIS — Z23 Encounter for immunization: Secondary | ICD-10-CM | POA: Diagnosis not present

## 2017-09-13 DIAGNOSIS — F5101 Primary insomnia: Secondary | ICD-10-CM | POA: Diagnosis not present

## 2017-09-13 DIAGNOSIS — F324 Major depressive disorder, single episode, in partial remission: Secondary | ICD-10-CM | POA: Diagnosis not present

## 2017-10-30 DIAGNOSIS — J0101 Acute recurrent maxillary sinusitis: Secondary | ICD-10-CM | POA: Diagnosis not present

## 2017-12-03 DIAGNOSIS — K080 Exfoliation of teeth due to systemic causes: Secondary | ICD-10-CM | POA: Diagnosis not present

## 2018-04-24 ENCOUNTER — Other Ambulatory Visit: Payer: Self-pay | Admitting: Family Medicine

## 2018-04-24 DIAGNOSIS — Z1231 Encounter for screening mammogram for malignant neoplasm of breast: Secondary | ICD-10-CM

## 2018-06-03 DIAGNOSIS — K080 Exfoliation of teeth due to systemic causes: Secondary | ICD-10-CM | POA: Diagnosis not present

## 2018-06-10 ENCOUNTER — Ambulatory Visit
Admission: RE | Admit: 2018-06-10 | Discharge: 2018-06-10 | Disposition: A | Discharge status: Home / Self Care | Payer: Federal, State, Local not specified - PPO | Source: Ambulatory Visit | Attending: Family Medicine | Admitting: Family Medicine

## 2018-06-10 DIAGNOSIS — Z1231 Encounter for screening mammogram for malignant neoplasm of breast: Secondary | ICD-10-CM

## 2018-08-11 ENCOUNTER — Encounter: Payer: Self-pay | Admitting: Internal Medicine

## 2018-08-28 ENCOUNTER — Ambulatory Visit (AMBULATORY_SURGERY_CENTER): Payer: Self-pay

## 2018-08-28 VITALS — Ht 65.0 in | Wt 187.2 lb

## 2018-08-28 DIAGNOSIS — Z800 Family history of malignant neoplasm of digestive organs: Secondary | ICD-10-CM

## 2018-08-28 MED ORDER — NA SULFATE-K SULFATE-MG SULF 17.5-3.13-1.6 GM/177ML PO SOLN: 1.0000 | Freq: Once | ORAL | 0 refills | Status: AC

## 2018-08-28 NOTE — Progress Notes (Signed)
No egg or soy allergy known to patient  No issues with past sedation with any surgeries  or procedures, no intubation problems  No diet pills per patient No home 02 use per patient  No blood thinners per patient  Pt denies issues with constipation. Pt states she is having rectal bleeding/bright red blood.  No A fib or A flutter  EMMI video sent to pt's e mail \. Pt declined

## 2018-09-02 ENCOUNTER — Encounter: Payer: Self-pay | Admitting: Internal Medicine

## 2018-09-02 DIAGNOSIS — F324 Major depressive disorder, single episode, in partial remission: Secondary | ICD-10-CM | POA: Diagnosis not present

## 2018-09-02 DIAGNOSIS — J301 Allergic rhinitis due to pollen: Secondary | ICD-10-CM | POA: Diagnosis not present

## 2018-09-02 DIAGNOSIS — Z23 Encounter for immunization: Secondary | ICD-10-CM | POA: Diagnosis not present

## 2018-09-02 DIAGNOSIS — F411 Generalized anxiety disorder: Secondary | ICD-10-CM | POA: Diagnosis not present

## 2018-09-08 DIAGNOSIS — R42 Dizziness and giddiness: Secondary | ICD-10-CM | POA: Diagnosis not present

## 2018-09-08 DIAGNOSIS — J0100 Acute maxillary sinusitis, unspecified: Secondary | ICD-10-CM | POA: Diagnosis not present

## 2018-09-08 DIAGNOSIS — H66003 Acute suppurative otitis media without spontaneous rupture of ear drum, bilateral: Secondary | ICD-10-CM | POA: Diagnosis not present

## 2018-09-11 ENCOUNTER — Telehealth: Payer: Self-pay | Admitting: Internal Medicine

## 2018-09-11 NOTE — Telephone Encounter (Signed)
Pt is scheduled for colon with Dr. Henrene Pastor on 09/18/18, she has an infection and was put on amoxicillin clavulanate 875 mgs for 10 days, she said that she will finish treatment the day before her procedure. She wants to know if she should r/s. Pls call her.

## 2018-09-11 NOTE — Telephone Encounter (Signed)
Returned phone call to patient, she finishes antibiotics the day before her procedure. Advised she is okay to proceed with her procedure as scheduled. She reports understanding.

## 2018-09-18 ENCOUNTER — Ambulatory Visit (AMBULATORY_SURGERY_CENTER): Payer: Federal, State, Local not specified - PPO | Admitting: Internal Medicine

## 2018-09-18 ENCOUNTER — Encounter: Payer: Self-pay | Admitting: Internal Medicine

## 2018-09-18 VITALS — BP 102/63 | HR 75 | Temp 97.5°F | Resp 14 | Ht 65.0 in | Wt 187.0 lb

## 2018-09-18 DIAGNOSIS — D123 Benign neoplasm of transverse colon: Secondary | ICD-10-CM | POA: Diagnosis not present

## 2018-09-18 DIAGNOSIS — D124 Benign neoplasm of descending colon: Secondary | ICD-10-CM

## 2018-09-18 DIAGNOSIS — K635 Polyp of colon: Secondary | ICD-10-CM | POA: Diagnosis not present

## 2018-09-18 DIAGNOSIS — Z1211 Encounter for screening for malignant neoplasm of colon: Secondary | ICD-10-CM

## 2018-09-18 DIAGNOSIS — Z800 Family history of malignant neoplasm of digestive organs: Secondary | ICD-10-CM

## 2018-09-18 DIAGNOSIS — D120 Benign neoplasm of cecum: Secondary | ICD-10-CM | POA: Diagnosis not present

## 2018-09-18 MED ORDER — SODIUM CHLORIDE 0.9 % IV SOLN: 500.0000 mL | Freq: Once | INTRAVENOUS | Status: DC

## 2018-09-18 NOTE — Progress Notes (Signed)
Called to room to assist during endoscopic procedure.  Patient ID and intended procedure confirmed with present staff. Received instructions for my participation in the procedure from the performing physician.  

## 2018-09-18 NOTE — Op Note (Signed)
[facility] Patient Name: Stephanie Deleon Procedure Date: 09/18/2018 8:55 AM MRN: [id number] Endoscopist: Docia Chuck. Henrene Pastor , MD Age: 57 Referring MD:  Date of Birth: 07-Dec-1960 Gender: Female Account #: [id number] Procedure:                Colonoscopy with cold snare polypectomy x 6 Indications:              Screening in patient at increased risk: Family                            history of 1st-degree relative (parent) with                            colorectal cancer around age 43 years Medicines:                Monitored Anesthesia Care Procedure:                Pre-Anesthesia Assessment:                           - Prior to the procedure, a History and Physical                            was performed, and patient medications and                            allergies were reviewed. The patient's tolerance of                            previous anesthesia was also reviewed. The risks                            and benefits of the procedure and the sedation                            options and risks were discussed with the patient.                            All questions were answered, and informed consent                            was obtained. Prior Anticoagulants: The patient has                            taken no previous anticoagulant or antiplatelet                            agents. ASA Grade Assessment: II - A patient with                            mild systemic disease. After reviewing the risks                            and benefits, the patient was deemed in  satisfactory condition to undergo the procedure.                           After obtaining informed consent, the colonoscope                            was passed under direct vision. Throughout the                            procedure, the patient's blood pressure, pulse, and                            oxygen saturations were monitored continuously. The     Colonoscope was introduced through the anus and                            advanced to the the cecum, identified by                            appendiceal orifice and ileocecal valve. The                            ileocecal valve, appendiceal orifice, and rectum                            were photographed. The quality of the bowel                            preparation was excellent. The colonoscopy was                            performed without difficulty. The patient tolerated                            the procedure well. The bowel preparation used was                            SUPREP. Scope In: 9:09:14 AM Scope Out: 9:33:25 AM Scope Withdrawal Time: 0 hours 11 minutes 34 seconds  Total Procedure Duration: 0 hours 24 minutes 11 seconds  Findings:                 Six sessile polyps were found in the descending                            colon, transverse colon and cecum. The polyps were                            3 to 7 mm in size. These polyps were removed with a                            cold snare. Resection and retrieval were complete.  The exam was otherwise without abnormality on                            direct and retroflexion views. Complications:            No immediate complications. Estimated blood loss:                            None. Estimated Blood Loss:     Estimated blood loss: none. Impression:               - Six 3 to 7 mm polyps in the descending colon, in                            the transverse colon and in the cecum, removed with                            a cold snare. Resected and retrieved.                           - The examination was otherwise normal on direct                            and retroflexion views. Recommendation:           - Repeat colonoscopy in 3 - 5 years for                            surveillance.                           - Patient has a contact number available for                             emergencies. The signs and symptoms of potential                            delayed complications were discussed with the                            patient. Return to normal activities tomorrow.                            Written discharge instructions were provided to the                            patient.                           - Resume previous diet.                           - Continue present medications.                           - Await pathology results. Docia Chuck. Henrene Pastor, MD 09/18/2018 9:40:20 AM This report has  been signed electronically.

## 2018-09-18 NOTE — Progress Notes (Signed)
Pt had an ear infection and is finishing up her Augmentin today. No fever and she is feeling better. Also she has been bleeding intermittently from her Rectum. Told the patient to let Dr. Henrene Pastor know. Sm

## 2018-09-18 NOTE — Patient Instructions (Signed)
Await pathology results. Continue present medications. Please read handout on polyps.     YOU HAD AN ENDOSCOPIC PROCEDURE TODAY AT THE [facility]:   Refer to the procedure report that was given to you for any specific questions about what was found during the examination.  If the procedure report does not answer your questions, please call your gastroenterologist to clarify.  If you requested that your care partner not be given the details of your procedure findings, then the procedure report has been included in a sealed envelope for you to review at your convenience later.  YOU SHOULD EXPECT: Some feelings of bloating in the abdomen. Passage of more gas than usual.  Walking can help get rid of the air that was put into your GI tract during the procedure and reduce the bloating. If you had a lower endoscopy (such as a colonoscopy or flexible sigmoidoscopy) you may notice spotting of blood in your stool or on the toilet paper. If you underwent a bowel prep for your procedure, you may not have a normal bowel movement for a few days.  Please Note:  You might notice some irritation and congestion in your nose or some drainage.  This is from the oxygen used during your procedure.  There is no need for concern and it should clear up in a day or so.  SYMPTOMS TO REPORT IMMEDIATELY:   Following lower endoscopy (colonoscopy or flexible sigmoidoscopy):  Excessive amounts of blood in the stool  Significant tenderness or worsening of abdominal pains  Swelling of the abdomen that is new, acute  Fever of 100F or higher   For urgent or emergent issues, a gastroenterologist can be reached at any hour by calling [phone].   DIET:  We do recommend a small meal at first, but then you may proceed to your regular diet.  Drink plenty of fluids but you should avoid alcoholic beverages for 24 hours.  ACTIVITY:  You should plan to take it easy for the rest of today and you should NOT DRIVE  or use heavy machinery until tomorrow (because of the sedation medicines used during the test).    FOLLOW UP: Our staff will call the number listed on your records the next business day following your procedure to check on you and address any questions or concerns that you may have regarding the information given to you following your procedure. If we do not reach you, we will leave a message.  However, if you are feeling well and you are not experiencing any problems, there is no need to return our call.  We will assume that you have returned to your regular daily activities without incident.  If any biopsies were taken you will be contacted by phone or by letter within the next 1-3 weeks.  Please call us at [phone] if you have not heard about the biopsies in 3 weeks.    SIGNATURES/CONFIDENTIALITY: You and/or your care partner have signed paperwork which will be entered into your electronic medical record.  These signatures attest to the fact that that the information above on your After Visit Summary has been reviewed and is understood.  Full responsibility of the confidentiality of this discharge information lies with you and/or your care-partner. 

## 2018-09-18 NOTE — Progress Notes (Signed)
Report to PACU, RN, vss, BBS= Clear.  

## 2018-09-19 ENCOUNTER — Telehealth: Payer: Self-pay

## 2018-09-19 NOTE — Telephone Encounter (Signed)
   Follow up Call-  Call back number 09/18/2018  Post procedure Call Back phone  # [phone]  Permission to leave phone message Yes  Some recent data might be hidden     Patient questions:  Do you have a fever, pain , or abdominal swelling? No. Pain Score  0 *  Have you tolerated food without any problems? Yes.    Have you been able to return to your normal activities? Yes.    Do you have any questions about your discharge instructions: Diet   No. Medications  No. Follow up visit  No.  Do you have questions or concerns about your Care? No.  Actions: * If pain score is 4 or above: No action needed, pain <4.

## 2018-09-23 ENCOUNTER — Encounter: Payer: Self-pay | Admitting: Internal Medicine

## 2018-11-20 DIAGNOSIS — J0140 Acute pansinusitis, unspecified: Secondary | ICD-10-CM | POA: Diagnosis not present

## 2018-12-08 DIAGNOSIS — K080 Exfoliation of teeth due to systemic causes: Secondary | ICD-10-CM | POA: Diagnosis not present

## 2019-08-03 DIAGNOSIS — K080 Exfoliation of teeth due to systemic causes: Secondary | ICD-10-CM | POA: Diagnosis not present

## 2019-09-15 DIAGNOSIS — Z0000 Encounter for general adult medical examination without abnormal findings: Secondary | ICD-10-CM | POA: Diagnosis not present

## 2019-09-15 DIAGNOSIS — Z23 Encounter for immunization: Secondary | ICD-10-CM | POA: Diagnosis not present

## 2019-09-15 DIAGNOSIS — Z1159 Encounter for screening for other viral diseases: Secondary | ICD-10-CM | POA: Diagnosis not present

## 2019-09-15 DIAGNOSIS — E7800 Pure hypercholesterolemia, unspecified: Secondary | ICD-10-CM | POA: Diagnosis not present

## 2019-12-29 DIAGNOSIS — Z23 Encounter for immunization: Secondary | ICD-10-CM | POA: Diagnosis not present

## 2020-01-19 DIAGNOSIS — Z23 Encounter for immunization: Secondary | ICD-10-CM | POA: Diagnosis not present

## 2020-06-27 ENCOUNTER — Other Ambulatory Visit: Payer: Self-pay

## 2020-06-27 ENCOUNTER — Ambulatory Visit (INDEPENDENT_AMBULATORY_CARE_PROVIDER_SITE_OTHER): Payer: Federal, State, Local not specified - PPO | Admitting: Otolaryngology

## 2020-06-27 VITALS — Temp 97.7°F

## 2020-06-27 DIAGNOSIS — J310 Chronic rhinitis: Secondary | ICD-10-CM

## 2020-06-27 DIAGNOSIS — H6983 Other specified disorders of Eustachian tube, bilateral: Secondary | ICD-10-CM | POA: Diagnosis not present

## 2020-06-27 NOTE — Progress Notes (Signed)
HPI: Stephanie Deleon is a 59 y.o. female who presents for evaluation of sinus and ear infection.  This began initially the second week of July when she developed a sinus infection was treated with Augmentin.  At that point she was having a lot of pressure in her head.  She states that she gets sinus infections a couple times every year.  She describes a lot of pressure in her head.  She also describes ringing in her ears as well as pressure in the ears.  She feels like she has fluid in the left ear.  She has not had a hearing test.  Past Medical History:  Diagnosis Date  . Allergy   . Anxiety   . Cold 12/2013   cough  . Family history of anesthesia complication    mother N/V  . Headache(784.0)    due to neck   Past Surgical History:  Procedure Laterality Date  . ABDOMINAL HYSTERECTOMY    . ANTERIOR CERVICAL DECOMP/DISCECTOMY FUSION N/A 01/01/2014   Procedure: ANTERIOR CERVICAL DECOMPRESSION/DISCECTOMY FUSION 2 LEVELS;  Surgeon: Marybelle Killings, MD;  Location: [facility];  Service: Orthopedics;  Laterality: N/A;  C4-5, C5-6 Anterior Cervical Discectomy, Fusion, Allograft, Plate and Screws  . BACK SURGERY     lumbr  . BREAST SURGERY  2012   reduction  . COLONOSCOPY    . POLYPECTOMY    . REDUCTION MAMMAPLASTY Bilateral   . TONSILLECTOMY     Social History   Socioeconomic History  . Marital status: Married    Spouse name: Not on file  . Number of children: Not on file  . Years of education: Not on file  . Highest education level: Not on file  Occupational History  . Not on file  Tobacco Use  . Smoking status: Current Every Day Smoker    Packs/day: 0.50    Years: 32.00    Pack years: 16.00    Types: Cigarettes  . Smokeless tobacco: Never Used  Vaping Use  . Vaping Use: Never used  Substance and Sexual Activity  . Alcohol use: Not on file    Comment: rarely  . Drug use: No  . Sexual activity: Not on file  Other Topics Concern  . Not on file  Social History Narrative  . Not on  file   Social Determinants of Health   Financial Resource Strain:   . Difficulty of Paying Living Expenses: Not on file  Food Insecurity:   . Worried About Charity fundraiser in the Last Year: Not on file  . Ran Out of Food in the Last Year: Not on file  Transportation Needs:   . Lack of Transportation (Medical): Not on file  . Lack of Transportation (Non-Medical): Not on file  Physical Activity:   . Days of Exercise per Week: Not on file  . Minutes of Exercise per Session: Not on file  Stress:   . Feeling of Stress : Not on file  Social Connections:   . Frequency of Communication with Friends and Family: Not on file  . Frequency of Social Gatherings with Friends and Family: Not on file  . Attends Religious Services: Not on file  . Active Member of Clubs or Organizations: Not on file  . Attends Archivist Meetings: Not on file  . Marital Status: Not on file   Family History  Problem Relation Age of Onset  . Colon cancer Father   . Breast cancer Neg Hx   . Rectal  cancer Neg Hx    Allergies  Allergen Reactions  . Latex     Blisters   Prior to Admission medications   Medication Sig Start Date End Date Taking? Authorizing Provider  ALPRAZolam Duanne Moron) 0.5 MG tablet  01/06/15  Yes [provider]  b complex vitamins tablet Take 1 tablet by mouth daily.   Yes [provider]  buPROPion (WELLBUTRIN XL) 300 MG 24 hr tablet  08/14/17  Yes [provider]  Cholecalciferol (VITAMIN D3) 50 MCG (2000 UT) TABS Take by mouth.   Yes [provider]  diphenhydrAMINE (BENADRYL) 25 mg capsule Take 25 mg by mouth at bedtime as needed for allergies.   Yes [provider]  levocetirizine (XYZAL) 5 MG tablet Take 5 mg by mouth every morning.   Yes [provider]  methocarbamol (ROBAXIN) 500 MG tablet Take 1 tablet (500 mg total) by mouth every 6 (six) hours as needed for muscle spasms (spasm). 01/01/14  Yes Phillips Hay, PA-C   montelukast (SINGULAIR) 10 MG tablet Take 10 mg by mouth every morning.   Yes [provider]  nicotine (NICOTROL) 10 MG inhaler Inhale 1 continuous puffing into the lungs daily as needed for smoking cessation.   Yes [provider]  oxyCODONE-acetaminophen (ROXICET) 5-325 MG per tablet Take 1-2 tablets by mouth every 4 (four) hours as needed. 01/01/14  Yes Phillips Hay, PA-C  pseudoephedrine-guaifenesin [facility] D) 60-600 MG per tablet Take 1 tablet by mouth daily as needed for congestion.   Yes [provider]     Positive ROS: Otherwise negative  All other systems have been reviewed and were otherwise negative with the exception of those mentioned in the HPI and as above.  Physical Exam: Constitutional: Alert, well-appearing, no acute distress Ears: External ears without lesions or tenderness. Ear canals are clear bilaterally.  Both TMs are clear with no middle ear fluid noted.  TMs with normal mobility.  Hearing screening with a tuning forks Weber lateralized to the right.  AC > BC bilaterally.  Subjectively she had a mild hearing loss in both ears with a 1024 tuning fork. Nasal: External nose without lesions. Septum slightly deviated to the left with mild rhinitis.  Both middle meatus regions were clear with no evidence of active infection..  Oral: Lips and gums without lesions. Tongue and palate mucosa without lesions. Posterior oropharynx clear. Neck: No palpable adenopathy or masses Respiratory: Breathing comfortably  Skin: No facial/neck lesions or rash noted.  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  SRT's were 5 dB bilaterally.  She had type A tympanograms bilaterally.  Procedures  Assessment: Chronic rhinitis.  History of sinus infections. Presently on clinical exam there is no clinical evidence of acute  infection.  Plan: Reviewed with her concerning the hearing test showing type a tympanograms bilaterally and that the sinus and head pressure may be related to something besides a sinus infection. We will go ahead and schedule her for a CT scan of the sinuses to rule out chronic sinus condition. Also recommended regular use of Nasacort 2 sprays each nostril at night which should help with congestion in the nose and sinuses. She will call us following the CT scan of the sinuses.  Radene Journey, MD

## 2020-06-28 ENCOUNTER — Emergency Department
Admission: RE | Admit: 2020-06-28 | Discharge: 2020-06-28 | Disposition: A | Discharge status: Home / Self Care | Payer: Federal, State, Local not specified - PPO | Source: Ambulatory Visit | Attending: Family Medicine | Admitting: Family Medicine

## 2020-06-28 ENCOUNTER — Other Ambulatory Visit (INDEPENDENT_AMBULATORY_CARE_PROVIDER_SITE_OTHER): Payer: Self-pay

## 2020-06-28 ENCOUNTER — Encounter (INDEPENDENT_AMBULATORY_CARE_PROVIDER_SITE_OTHER): Payer: Self-pay

## 2020-06-28 ENCOUNTER — Other Ambulatory Visit: Payer: Self-pay

## 2020-06-28 VITALS — BP 106/74 | HR 92 | Temp 98.3°F | Resp 18

## 2020-06-28 DIAGNOSIS — G43011 Migraine without aura, intractable, with status migrainosus: Secondary | ICD-10-CM | POA: Diagnosis not present

## 2020-06-28 DIAGNOSIS — J329 Chronic sinusitis, unspecified: Secondary | ICD-10-CM

## 2020-06-28 MED ORDER — METOCLOPRAMIDE HCL 5 MG/ML IJ SOLN
5.0000 mg | Freq: Once | INTRAMUSCULAR | Status: AC
  Administered 2020-06-28: 5 mg via INTRAMUSCULAR

## 2020-06-28 MED ORDER — KETOROLAC TROMETHAMINE 60 MG/2ML IM SOLN
60.0000 mg | Freq: Once | INTRAMUSCULAR | Status: AC
  Administered 2020-06-28: 60 mg via INTRAMUSCULAR

## 2020-06-28 MED ORDER — DEXAMETHASONE SODIUM PHOSPHATE 10 MG/ML IJ SOLN
10.0000 mg | Freq: Once | INTRAMUSCULAR | Status: AC
  Administered 2020-06-28: 10 mg via INTRAMUSCULAR

## 2020-06-28 NOTE — ED Provider Notes (Signed)
[facility]    CSN: [id number] Arrival date & time: 06/28/20  1759      History   Chief Complaint Chief Complaint  Patient presents with   Appointment    6pm   Migraine    HPI Stephanie Deleon is a 59 y.o. female.   Patient complains of persistent migraine headache for two weeks, with intermittent dizziness, tinnitus, and nausea.  She states that she has had two migraines in the past 4 years, but they did not last this long.  She was concerned that she might have an ear infection, but a visit to ENT yesterday revealed that ears were normal.  She has a history of perennial rhinitis, and her ENT plans to schedule a CT of her sinuses.  She has had no relief from Tylenol, Naprosyn, and decongestants.  The history is provided by the patient.    Past Medical History:  Diagnosis Date   Allergy    Anxiety    Cold 12/2013   cough   Family history of anesthesia complication    mother N/V   Headache(784.0)    due to neck    Patient Active Problem List   Diagnosis Date Noted   Cervical spondylosis without myelopathy 01/01/2014    Class: Diagnosis of   Cervical spondylosis 01/01/2014   URI 08/31/2008   SINUSITIS 06/02/2008   DEPRESSION 11/25/2007   GASTROESOPHAGEAL REFLUX DISEASE 11/25/2007   IRRITABLE BOWEL SYNDROME 11/25/2007   COLONIC POLYPS, HYPERPLASTIC 12/26/2001   HIATAL HERNIA 12/26/2001    Past Surgical History:  Procedure Laterality Date   ABDOMINAL HYSTERECTOMY     ANTERIOR CERVICAL DECOMP/DISCECTOMY FUSION N/A 01/01/2014   Procedure: ANTERIOR CERVICAL DECOMPRESSION/DISCECTOMY FUSION 2 LEVELS;  Surgeon: Marybelle Killings, MD;  Location: [facility];  Service: Orthopedics;  Laterality: N/A;  C4-5, C5-6 Anterior Cervical Discectomy, Fusion, Allograft, Plate and Screws   BACK SURGERY     lumbr   BREAST SURGERY  2012   reduction   COLONOSCOPY     POLYPECTOMY     REDUCTION MAMMAPLASTY Bilateral    TONSILLECTOMY      OB History   No  obstetric history on file.      Home Medications    Prior to Admission medications   Medication Sig Start Date End Date Taking? Authorizing Provider  ALPRAZolam Duanne Moron) 0.5 MG tablet  01/06/15   [provider]  b complex vitamins tablet Take 1 tablet by mouth daily.    [provider]  buPROPion (WELLBUTRIN XL) 300 MG 24 hr tablet  08/14/17   [provider]  Cholecalciferol (VITAMIN D3) 50 MCG (2000 UT) TABS Take by mouth.    [provider]  diphenhydrAMINE (BENADRYL) 25 mg capsule Take 25 mg by mouth at bedtime as needed for allergies.    [provider]  levocetirizine (XYZAL) 5 MG tablet Take 5 mg by mouth every morning.    [provider]  methocarbamol (ROBAXIN) 500 MG tablet Take 1 tablet (500 mg total) by mouth every 6 (six) hours as needed for muscle spasms (spasm). 01/01/14   Phillips Hay, PA-C  montelukast (SINGULAIR) 10 MG tablet Take 10 mg by mouth every morning.    [provider]  nicotine (NICOTROL) 10 MG inhaler Inhale 1 continuous puffing into the lungs daily as needed for smoking cessation.    [provider]  oxyCODONE-acetaminophen (ROXICET) 5-325 MG per tablet Take 1-2 tablets by mouth every 4 (four) hours as needed. 01/01/14   Lynnae Sandhoff,  Freda Munro, PA-C  pseudoephedrine-guaifenesin [facility] D) 60-600 MG per tablet Take 1 tablet by mouth daily as needed for congestion.    [provider]    Family History Family History  Problem Relation Age of Onset   Colon cancer Father    Breast cancer Neg Hx    Rectal cancer Neg Hx     Social History Social History   Tobacco Use   Smoking status: Current Every Day Smoker    Packs/day: 0.50    Years: 32.00    Pack years: 16.00    Types: Cigarettes   Smokeless tobacco: Never Used  Scientific laboratory technician Use: Never used  Substance Use Topics   Alcohol use: Not on file    Comment: rarely   Drug use: No     Allergies    Latex   Review of Systems Review of Systems No sore throat No cough No pleuritic pain No wheezing + nasal congestion No post-nasal drainage No sinus pain/pressure No itchy/red eyes No earache + dizzineess + tinnitus No hemoptysis No SOB No fever/chills + nausea No vomiting No abdominal pain No diarrhea No urinary symptoms No skin rash No fatigue No myalgias + headache  No vision changes or other neurologic symptoms.  Physical Exam Triage Vital Signs ED Triage Vitals [06/28/20 1842]  Enc Vitals Group     BP 106/74     Pulse Rate 92     Resp 18     Temp 98.3 F (36.8 C)     Temp Source Oral     SpO2 96 %     Weight      Height      Head Circumference      Peak Flow      Pain Score 9     Pain Loc      Pain Edu?      Excl. in [place]?    No data found.  Updated Vital Signs BP 106/74 (BP Location: Right Arm)    Pulse 92    Temp 98.3 F (36.8 C) (Oral)    Resp 18    SpO2 96%   Visual Acuity Right Eye Distance:   Left Eye Distance:   Bilateral Distance:    Right Eye Near:   Left Eye Near:    Bilateral Near:     Physical Exam Nursing notes and Vital Signs reviewed. Appearance:  Patient appears stated age, and in no acute distress. Head:  No temporal artery tenderness. Eyes:  Pupils are equal, round, and reactive to light and accomodation.  Mild photophobia present.  Fundi benign.  Extraocular movement is intact.  Conjunctivae are not inflamed  Ears:  Canals normal.  Tympanic membranes normal.  Nose:  Congested turbinates.  No sinus tenderness.  Pharynx:  Normal Neck:  Supple. No adenopathy. Lungs:  Clear to auscultation.  Breath sounds are equal.  Moving air well. Heart:  Regular rate and rhythm without murmurs, rubs, or gallops.  Abdomen:  Nontender without masses or hepatosplenomegaly.  Bowel sounds are present.  No CVA or flank tenderness.  Extremities:  No edema.  Skin:  No rash present.  Neurologic:  Cranial nerves 2 through 12 are normal.   Patellar, achilles, and elbow reflexes are normal.  Cerebellar function is intact (finger-to-nose and rapid alternating hand movement).  Gait and station are normal.  Romberg negative.  UC Treatments / Results  Labs (all labs ordered are listed, but only abnormal results are displayed) Labs Reviewed - No data  to display  EKG   Radiology No results found.  Procedures Procedures (including critical care time)  Medications Ordered in UC Medications  ketorolac (TORADOL) injection 60 mg (has no administration in time range)  metoCLOPramide (REGLAN) injection 5 mg (has no administration in time range)  dexamethasone (DECADRON) injection 10 mg (has no administration in time range)    Initial Impression / Assessment and Plan / UC Course  I have reviewed the triage vital signs and the nursing notes.  Pertinent labs & imaging results that were available during my care of the patient were reviewed by me and considered in my medical decision making (see chart for details).   Benign neurologic exam. Administered Toradol 60mg  IM, Reglan 5mg  IM, and Decadron 10mg  IM.  Followup with ENT for CT scan of sinuses as scheduled   Final Clinical Impressions(s) / UC Diagnoses   Final diagnoses:  Intractable migraine without aura and with status migrainosus     Discharge Instructions     If symptoms become significantly worse during the night or over the weekend, proceed to the local emergency room.     ED Prescriptions    None        Kandra Nicolas, MD 07/01/20 [phone]

## 2020-06-28 NOTE — ED Triage Notes (Signed)
Pt c/o migraine and severe head pressure, dizziness and nausea x 2 weeks. Saw ENT yesterday, thought it may be an ear infection but ears were clear. Has had migraines in past. Pain 9/10. Tylenolol, naprosyn, decongestants, sudafed with no relief.

## 2020-06-28 NOTE — Progress Notes (Signed)
CT max

## 2020-06-28 NOTE — Discharge Instructions (Addendum)
If symptoms become significantly worse during the night or over the weekend, proceed to the local emergency room.

## 2020-07-08 ENCOUNTER — Ambulatory Visit
Admission: RE | Admit: 2020-07-08 | Discharge: 2020-07-08 | Disposition: A | Discharge status: Home / Self Care | Payer: Federal, State, Local not specified - PPO | Source: Ambulatory Visit | Attending: Otolaryngology | Admitting: Otolaryngology

## 2020-07-08 DIAGNOSIS — J329 Chronic sinusitis, unspecified: Secondary | ICD-10-CM

## 2020-11-07 ENCOUNTER — Ambulatory Visit: Payer: Federal, State, Local not specified - PPO | Admitting: Internal Medicine

## 2020-11-07 ENCOUNTER — Encounter: Payer: Self-pay | Admitting: Internal Medicine

## 2020-11-07 VITALS — BP 112/84 | HR 91 | Ht 65.0 in | Wt 197.4 lb

## 2020-11-07 DIAGNOSIS — K625 Hemorrhage of anus and rectum: Secondary | ICD-10-CM | POA: Diagnosis not present

## 2020-11-07 DIAGNOSIS — K219 Gastro-esophageal reflux disease without esophagitis: Secondary | ICD-10-CM | POA: Diagnosis not present

## 2020-11-07 DIAGNOSIS — R1310 Dysphagia, unspecified: Secondary | ICD-10-CM | POA: Diagnosis not present

## 2020-11-07 DIAGNOSIS — Z86010 Personal history of colonic polyps: Secondary | ICD-10-CM

## 2020-11-07 MED ORDER — PANTOPRAZOLE SODIUM 40 MG PO TBEC: 40.0000 mg | DELAYED_RELEASE_TABLET | Freq: Every day | ORAL | 3 refills | Status: DC

## 2020-11-07 MED ORDER — PLENVU 140 G PO SOLR: 1.0000 | Freq: Once | ORAL | 0 refills | Status: AC

## 2020-11-07 NOTE — Progress Notes (Signed)
HISTORY OF PRESENT ILLNESS:  Stephanie Deleon is a 60 y.o. female who presents today for follow-up regarding management of her chronic GERD, dysphagia, rectal bleeding, and rectal discomfort.  The patient has a family history of colon cancer in her parent around age 79.  She has undergone colonoscopy on several occasions past.  Most recently December 2019 she was found to have 6 polyps which were sessile serrated polyps, tubular adenoma, and hyperplastic.  Follow-up in 3 years recommended.  She tells me that she has noticed rectal bleeding associated with rectal discomfort past several months.  She thinks maybe this is related to hemorrhoids.  No significant hemorrhoids noted on last exam.  She also reports significant problems with heartburn and indigestion as well as intermittent solid food dysphagia.  She has had this problem for years.  It is worsening.  She takes antacids on a regular basis.  Prescribe famotidine has not been helpful.  She did have an upper endoscopy 2003 which revealed a hiatal hernia.  REVIEW OF SYSTEMS:  All non-GI ROS negative unless otherwise stated in the HPI except for sinus allergy, anxiety, headaches, urinary leakage  Past Medical History:  Diagnosis Date  . Allergy   . Anxiety   . Cold 12/2013   cough  . Family history of anesthesia complication    mother N/V  . Headache(784.0)    due to neck    Past Surgical History:  Procedure Laterality Date  . ABDOMINAL HYSTERECTOMY    . ANTERIOR CERVICAL DECOMP/DISCECTOMY FUSION N/A 01/01/2014   Procedure: ANTERIOR CERVICAL DECOMPRESSION/DISCECTOMY FUSION 2 LEVELS;  Surgeon: Stephanie Killings, MD;  Location: [facility];  Service: Orthopedics;  Laterality: N/A;  C4-5, C5-6 Anterior Cervical Discectomy, Fusion, Allograft, Plate and Screws  . BACK SURGERY     lumbr  . BREAST SURGERY  2012   reduction  . COLONOSCOPY    . POLYPECTOMY    . REDUCTION MAMMAPLASTY Bilateral   . TONSILLECTOMY      Social History Stephanie Deleon   reports that she has been smoking cigarettes. She has a 16.00 pack-year smoking history. She has never used smokeless tobacco. She reports that she does not use drugs. No history on file for alcohol use.  family history includes Colon cancer in her father.  Allergies  Allergen Reactions  . Latex     Blisters       PHYSICAL EXAMINATION: Vital signs: BP 112/84 (BP Location: Left Arm, Patient Position: Sitting)   Pulse 91   Ht 5\' 5"  (1.651 m)   Wt 197 lb 6.4 oz (89.5 kg)   SpO2 98%   BMI 32.85 kg/m   Constitutional: generally well-appearing, no acute distress Psychiatric: alert and oriented x3, cooperative Eyes: extraocular movements intact, anicteric, conjunctiva pink Mouth: oral pharynx moist, no lesions Neck: supple no lymphadenopathy Cardiovascular: heart regular rate and rhythm, no murmur Lungs: clear to auscultation bilaterally Abdomen: soft, nontender, nondistended, no obvious ascites, no peritoneal signs, normal bowel sounds, no organomegaly Rectal: Deferred to colonoscopy Extremities: no clubbing, cyanosis, or lower extremity edema bilaterally Skin: no lesions on visible extremities Neuro: No focal deficits.  Cranial nerves intact  ASSESSMENT:  1.  Chronic GERD requiring chronic antacid use.  Not responsive to H2 receptor antagonist therapy. 2.  Intermittent solid food dysphagia.  Rule out peptic stricture 3.  Bleeding and discomfort.  Possibly fissure.  No hemorrhoids on previous colonoscopy.  Due for colonoscopy this year history of multiple colon polyps and family history of colon cancer.  4.  Personal history of SSP tubular adenomas. 5.  History of colon cancer in parent around age 1  PLAN:  27.  Reflux precautions 2.  Prescribe pantoprazole 40 mg daily.  Medication risks reviewed 3.  Schedule upper endoscopy with possible esophageal dilation.The nature of the procedure, as well as the risks, benefits, and alternatives were carefully and thoroughly reviewed with  the patient. Ample time for discussion and questions allowed. The patient understood, was satisfied, and agreed to proceed. 4.  Schedule colonoscopy for polyp surveillance and to evaluate rectal bleeding.The nature of the procedure, as well as the risks, benefits, and alternatives were carefully and thoroughly reviewed with the patient. Ample time for discussion and questions allowed. The patient understood, was satisfied, and agreed to proceed. 5.  If benign anorectal pathology found to be the source of bleeding, treat accordingly based on the specific diagnosis.

## 2020-11-07 NOTE — Patient Instructions (Signed)
If you are age 62 or older, your body mass index should be between 23-30. Your Body mass index is 32.85 kg/m. If this is out of the aforementioned range listed, please consider follow up with your Primary Care Provider.  If you are age 46 or younger, your body mass index should be between 19-25. Your Body mass index is 32.85 kg/m. If this is out of the aformentioned range listed, please consider follow up with your Primary Care Provider.   We have sent the following medications to your pharmacy for you to pick up at your convenience:  Pantoprazole.  You have been scheduled for an endoscopy and colonoscopy. Please follow the written instructions given to you at your visit today. Please pick up your prep supplies at the pharmacy within the next 1-3 days. If you use inhalers (even only as needed), please bring them with you on the day of your procedure.

## 2020-11-27 IMAGING — CT CT MAXILLOFACIAL W/O CM
1 series · 16 of 30 positions shown, 20 images · non-contrast
Comparison: None.

CLINICAL DATA: Chronic sinusitis with headaches

EXAM:
CT MAXILLOFACIAL WITHOUT CONTRAST
TECHNIQUE: Multidetector CT images of the paranasal sinuses were obtained using
the standard protocol without intravenous contrast.

[Series 4: soft tissue · axial · 0.44mm/px · z∈[-184,-66]mm · 16 of 128 slices shown, 20 images]
[im 5/128  brain]
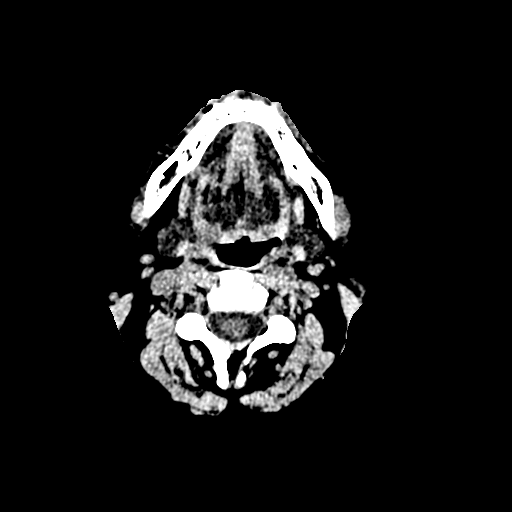
[im 5/128  bone]
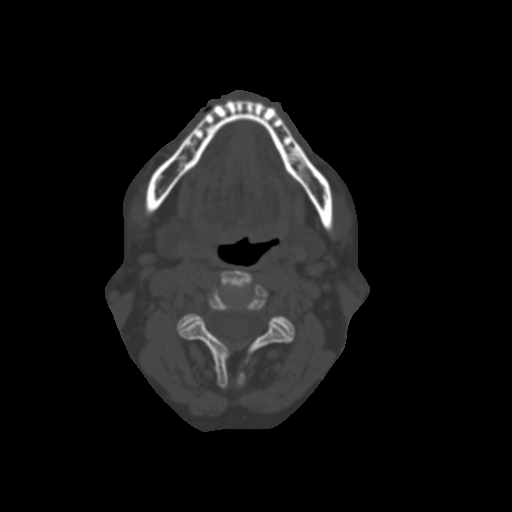
[im 14/128  bone]
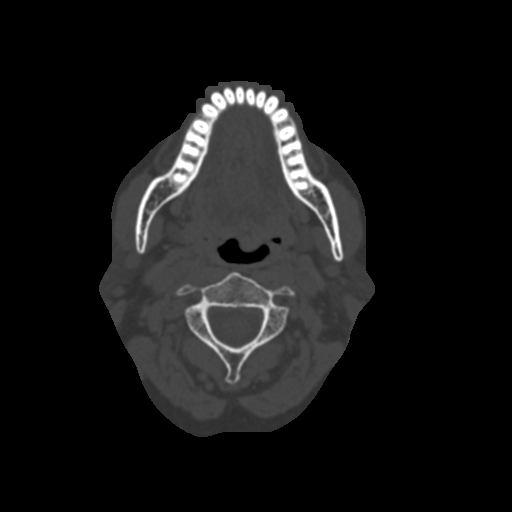
[im 22/128  bone]
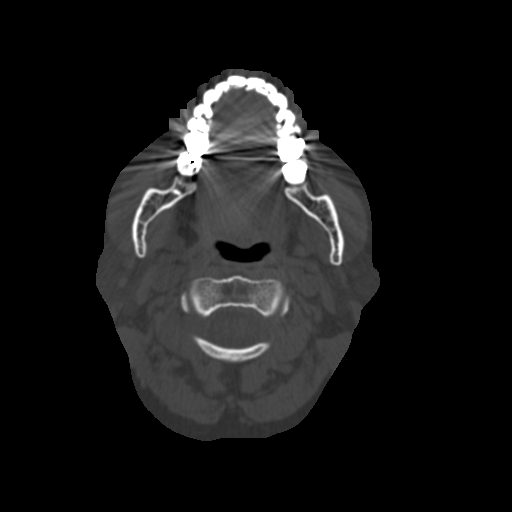
[im 31/128  bone]
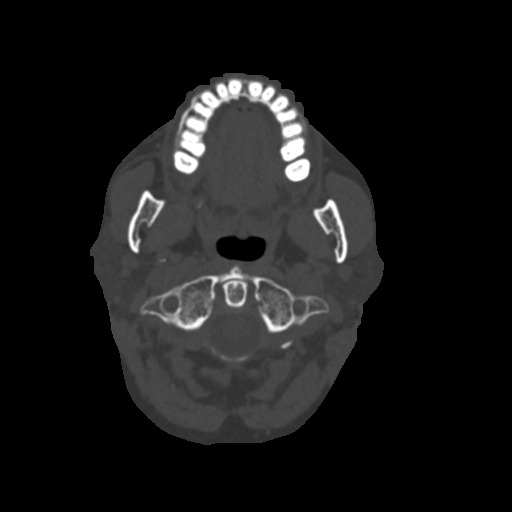
[im 36/128  brain]
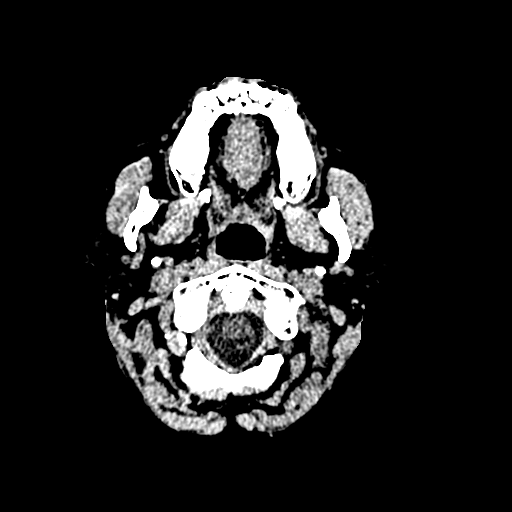
[im 36/128  bone]
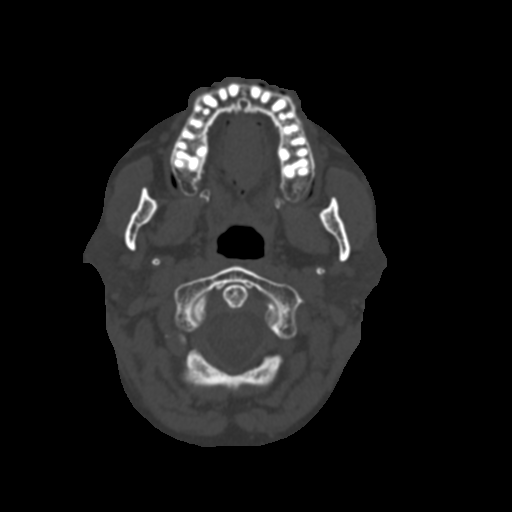
[im 44/128  bone]
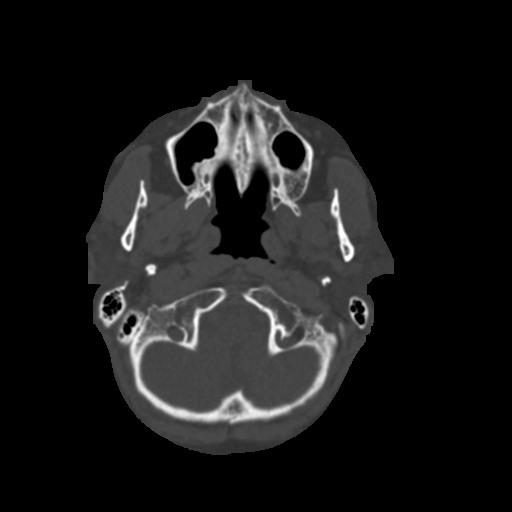
[im 53/128  bone]
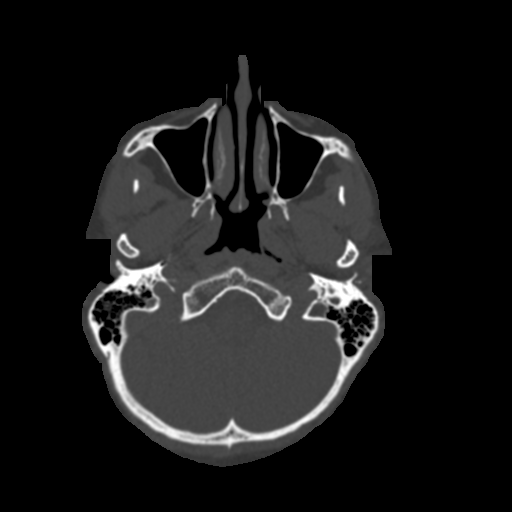
[im 62/128  bone]
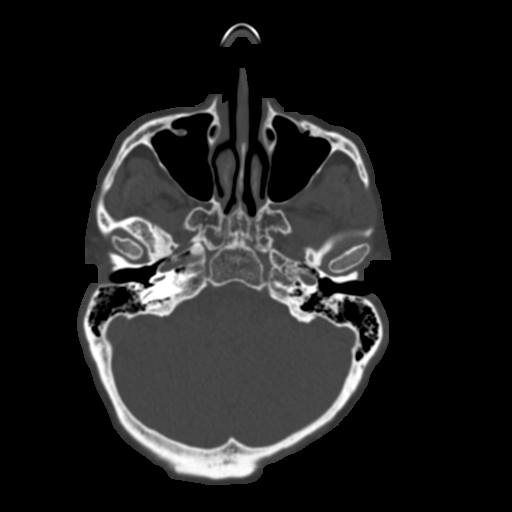
[im 66/128  brain]
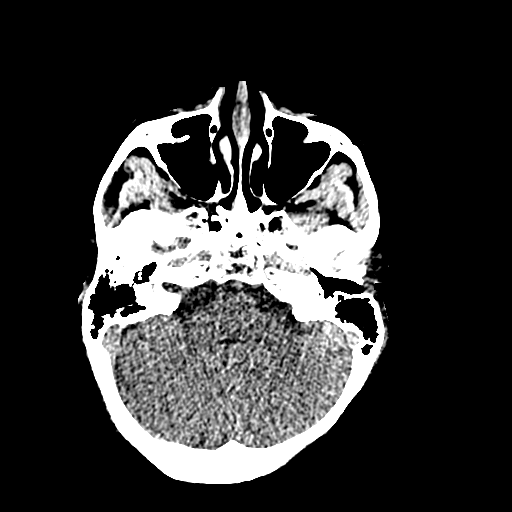
[im 66/128  bone]
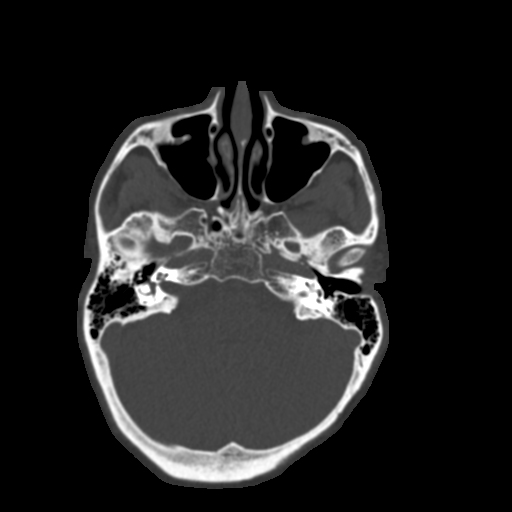
[im 75/128  bone]
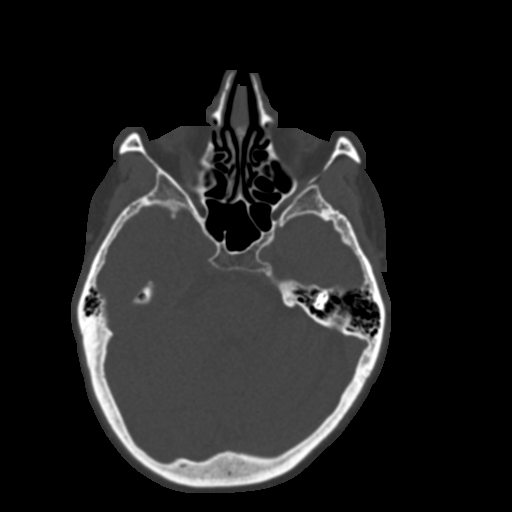
[im 84/128  bone]
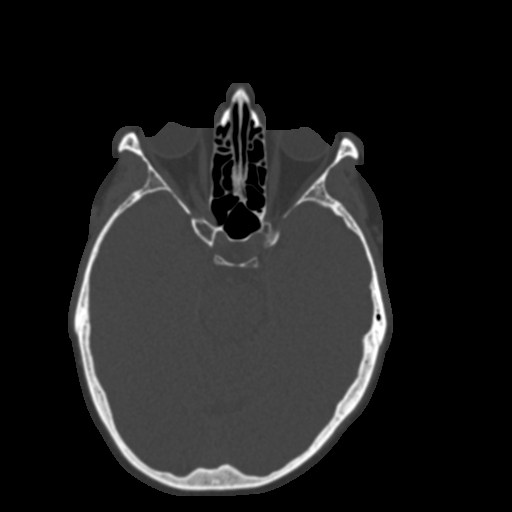
[im 92/128  bone]
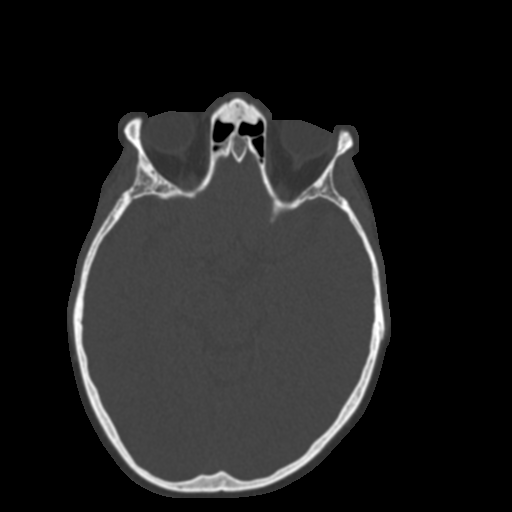
[im 97/128  brain]
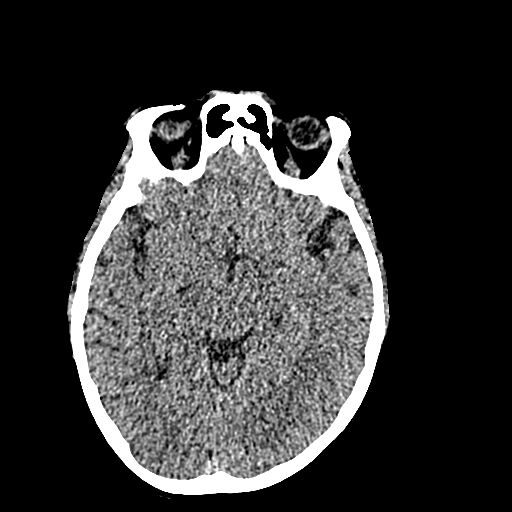
[im 97/128  bone]
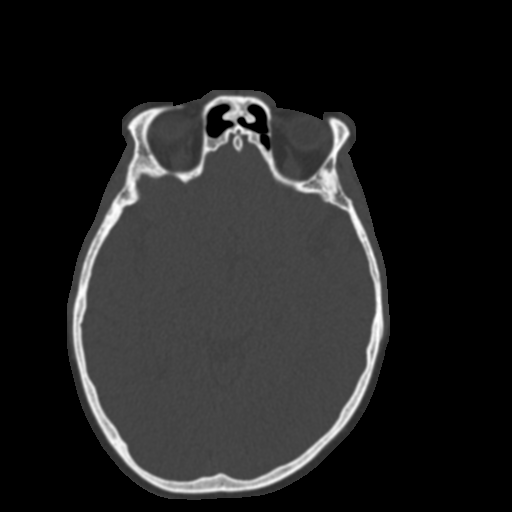
[im 106/128  bone]
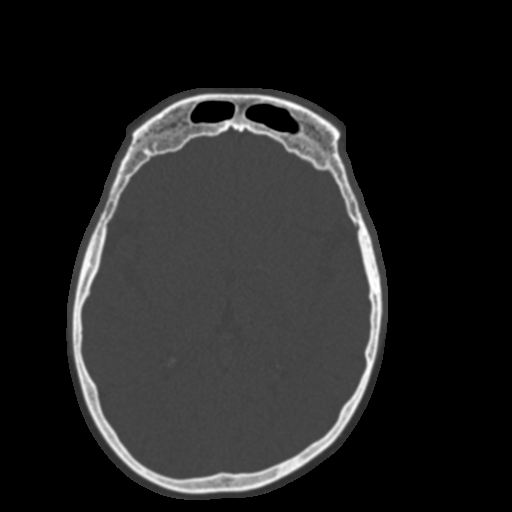
[im 114/128  bone]
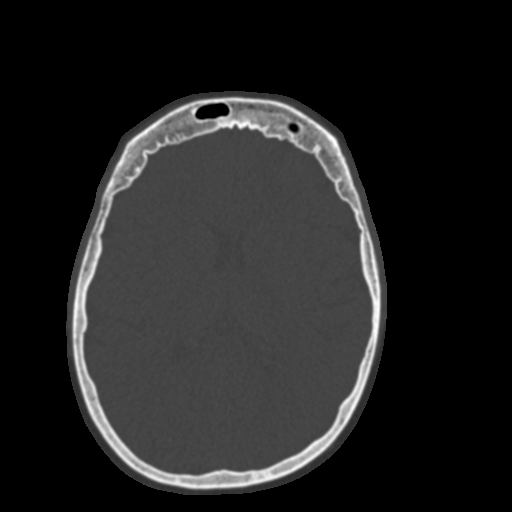
[im 123/128  bone]
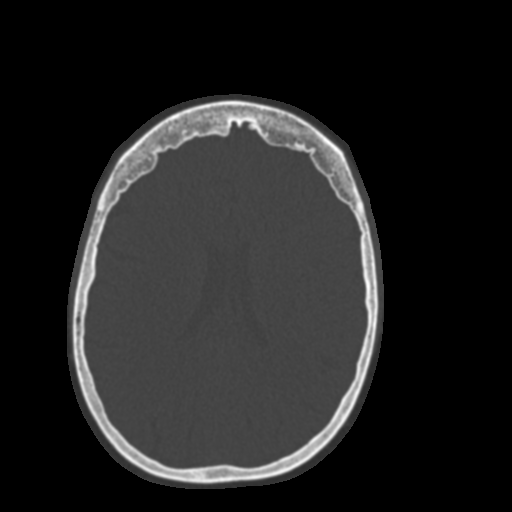

[16 of 30 positions shown; findings below may reference images not displayed]

FINDINGS: Paranasal sinuses:

Frontal: Normally aerated. Patent frontal sinus drainage pathways.

Ethmoid: Normally aerated.

Maxillary: Normally aerated.

Sphenoid: Normally aerated. Patent sphenoethmoidal recesses.

Right ostiomeatal unit: Patent.

Left ostiomeatal unit: Patent.

Nasal passages: Patent. Intact nasal septum is midline.

Other: Orbits and intracranial compartment are unremarkable. Visible
mastoid air cells are normally aerated.
IMPRESSION: Normally aerated paranasal sinuses.  Patent sinus drainage pathways.

## 2020-11-29 ENCOUNTER — Encounter: Payer: Self-pay | Admitting: *Deleted

## 2020-11-29 ENCOUNTER — Ambulatory Visit: Payer: Federal, State, Local not specified - PPO | Admitting: Neurology

## 2020-11-29 ENCOUNTER — Encounter: Payer: Self-pay | Admitting: Neurology

## 2020-11-29 VITALS — BP 109/70 | HR 83 | Ht 65.0 in | Wt 199.0 lb

## 2020-11-29 DIAGNOSIS — H938X3 Other specified disorders of ear, bilateral: Secondary | ICD-10-CM | POA: Diagnosis not present

## 2020-11-29 DIAGNOSIS — R519 Headache, unspecified: Secondary | ICD-10-CM

## 2020-11-29 DIAGNOSIS — H9193 Unspecified hearing loss, bilateral: Secondary | ICD-10-CM

## 2020-11-29 DIAGNOSIS — H9313 Tinnitus, bilateral: Secondary | ICD-10-CM | POA: Diagnosis not present

## 2020-11-29 DIAGNOSIS — G500 Trigeminal neuralgia: Secondary | ICD-10-CM

## 2020-11-29 MED ORDER — METHYLPREDNISOLONE 4 MG PO TBPK: ORAL_TABLET | ORAL | 1 refills | Status: DC

## 2020-11-29 NOTE — Patient Instructions (Signed)
Steroids 6 days MRI of the brain - will call you   Tinnitus Tinnitus refers to hearing a sound when there is no actual source for that sound. This is often described as ringing in the ears. However, people with this condition may hear a variety of noises, in one ear or in both ears. The sounds of tinnitus can be soft, loud, or somewhere in between. Tinnitus can last for a few seconds or can be constant for days. It may go away without treatment and come back at various times. When tinnitus is constant or happens often, it can lead to other problems, such as trouble sleeping and trouble concentrating. Almost everyone experiences tinnitus at some point. Tinnitus that is long-lasting (chronic) or comes back often (recurs) may require medical attention. What are the causes? The cause of tinnitus is often not known. In some cases, it can result from:  Exposure to loud noises from machinery, music, or other sources.  An object (foreign body) stuck in the ear.  Earwax buildup.  Drinking alcohol or caffeine.  Taking certain medicines.  Age-related hearing loss. It may also be caused by medical conditions such as:  Ear or sinus infections.  High blood pressure.  Heart diseases.  Anemia.  Allergies.  Meniere's disease.  Thyroid problems.  Tumors.  A weak, bulging blood vessel (aneurysm) near the ear. What are the signs or symptoms? The main symptom of tinnitus is hearing a sound when there is no source for that sound. It may sound like:  Buzzing.  Roaring.  Ringing.  Blowing air.  Hissing.  Whistling.  Sizzling.  Humming.  Running water.  A musical note.  Tapping. Symptoms may affect only one ear (unilateral) or both ears (bilateral). How is this diagnosed? Tinnitus is diagnosed based on your symptoms, your medical history, and a physical exam. Your health care provider may do a thorough hearing test (audiologic exam) if your tinnitus:  Is  unilateral.  Causes hearing difficulties.  Lasts 6 months or longer. You may work with a health care provider who specializes in hearing disorders (audiologist). You may be asked questions about your symptoms and how they affect your daily life. You may have other tests done, such as:  CT scan.  MRI.  An imaging test of how blood flows through your blood vessels (angiogram). How is this treated? Treating an underlying medical condition can sometimes make tinnitus go away. If your tinnitus continues, other treatments may include:  Medicines.  Therapy and counseling to help you manage the stress of living with tinnitus.  Sound generators to mask the tinnitus. These include: ? Tabletop sound machines that play relaxing sounds to help you fall asleep. ? Wearable devices that fit in your ear and play sounds or music. ? Acoustic neural stimulation. This involves using headphones to listen to music that contains an auditory signal. Over time, listening to this signal may change some pathways in your brain and make you less sensitive to tinnitus. This treatment is used for very severe cases when no other treatment is working.  Using hearing aids or cochlear implants if your tinnitus is related to hearing loss. Hearing aids are worn in the outer ear. Cochlear implants are surgically placed in the inner ear. Follow these instructions at home: Managing symptoms  When possible, avoid being in loud places and being exposed to loud sounds.  Wear hearing protection, such as earplugs, when you are exposed to loud noises.  Use a white noise machine, a humidifier, or  other devices to mask the sound of tinnitus.  Practice techniques for reducing stress, such as meditation, yoga, or deep breathing. Work with your health care provider if you need help with managing stress.  Sleep with your head slightly raised. This may reduce the impact of tinnitus.      General instructions  Do not use  stimulants, such as nicotine, alcohol, or caffeine. Talk with your health care provider about other stimulants to avoid. Stimulants are substances that can make you feel alert and attentive by increasing certain activities in the body (such as heart rate and blood pressure). These substances may make tinnitus worse.  Take over-the-counter and prescription medicines only as told by your health care provider.  Try to get plenty of sleep each night.  Keep all follow-up visits as told by your health care provider. This is important. Contact a health care provider if:  Your tinnitus continues for 3 weeks or longer without stopping.  You develop sudden hearing loss.  Your symptoms get worse or do not get better with home care.  You feel you are not able to manage the stress of living with tinnitus. Get help right away if:  You develop tinnitus after a head injury.  You have tinnitus along with any of the following: ? Dizziness. ? Loss of balance. ? Nausea and vomiting. ? Sudden, severe headache. These symptoms may represent a serious problem that is an emergency. Do not wait to see if the symptoms will go away. Get medical help right away. Call your local emergency services (911 in the [place] Do not drive yourself to the hospital. Summary  Tinnitus refers to hearing a sound when there is no actual source for that sound. This is often described as ringing in the ears.  Symptoms may affect only one ear (unilateral) or both ears (bilateral).  Use a white noise machine, a humidifier, or other devices to mask the sound of tinnitus.  Do not use stimulants, such as nicotine, alcohol, or caffeine. Talk with your health care provider about other stimulants to avoid. These substances may make tinnitus worse. This information is not intended to replace advice given to you by your health care provider. Make sure you discuss any questions you have with your health care provider. Document Revised:  04/07/2019 Document Reviewed: 07/04/2017 Elsevier Patient Education  2021 [employer].

## 2020-11-29 NOTE — Progress Notes (Addendum)
[facility]    Provider:  Dr Jaynee Eagles Requesting Provider: Shirline Frees, MD Primary Care Provider:  Shirline Frees, MD  CC:  Tinnitus  HPI:  Stephanie Deleon is a 60 y.o. female here as requested by Shirline Frees, MD for Tinnitus.  Past medical history lumbar disc disease, IBS, hyperlipidemia, headache, depression, constipation, anxiety, migraine, depression, neck pain, other chronic pain, tinnitus of both ears, insomnia.  I reviewed notes from Dr. Kenton Kingfisher: She had a "negative ENT exam".  In July she had a bilateral ear infection and sinus infection, [facility] and given [facility], she has continued pressure and tinnitus, she had an ENT evaluation, they did hearing testing with mild high and testing, they suggested a neurology evaluation.  I reviewed Dr. Merleen Nicely' exam.  It showed normal general, head, ears, eyes, nose, throat, neck, lungs, heart, abdomen, extremities and, neurologic exam.  Blood work was completed at her last visit including CBC, CMP, TSH but I did not receive those results.  She is here alone. In July [facility] bc she has a sinus infection, she couldn't clear her ears, both ears were infected, she was given augmentin for 10 days, she still wasn't feeling better and she she still had pressure in her head, she went to an ENT who looked in ears and everything looked ok with some mild high freq hearing loss, she saw her primary care. The ringing in the ears started after Augmentin treatment, it is in both ears, continuous ringing, not pulsed, she still has pain in her ears/pressure, the ENT gave her flonase, zyrtec and neither helped, she has pressure in the ears, same laying down or siting up, no vision changes, she gets pain a sharp shooting pain in the temple on the left followed by a headache. No dizziness. Never relents. Hasn't improved. She has allergies. She stopped honey and tried to see if changing diet helped but it  did not. No other focal neurologic deficits, associated symptoms, inciting events or modifiable factors.  Reviewed notes, labs and imaging from outside physicians, which showed:  bloodwork 12/14/202 Cbc unremarkable, essentially normal cmp normal with bun 12 and creatinine 0.87tsh 1.1  CT maxillofacial 07/08/2020: personally reviewed images:  COMPARISON:  None.  FINDINGS: Paranasal sinuses:  Frontal: Normally aerated. Patent frontal sinus drainage pathways.  Ethmoid: Normally aerated.  Maxillary: Normally aerated.  Sphenoid: Normally aerated. Patent sphenoethmoidal recesses.  Right ostiomeatal unit: Patent.  Left ostiomeatal unit: Patent.  Nasal passages: Patent. Intact nasal septum is midline.  Other: Orbits and intracranial compartment are unremarkable. Visible mastoid air cells are normally aerated.  IMPRESSION: Normally aerated paranasal sinuses.  Patent sinus drainage pathways.  Review of Systems: Patient complains of symptoms per HPI as well as the following symptoms: ear fullness. Pertinent negatives and positives per HPI. All others negative.   Social History   Socioeconomic History  . Marital status: Married    Spouse name: Not on file  . Number of children: Not on file  . Years of education: Not on file  . Highest education level: Master's degree (e.g., MA, MS, MEng, MEd, MSW, MBA)  Occupational History  . Not on file  Tobacco Use  . Smoking status: Current Every Day Smoker    Packs/day: 0.50    Years: 32.00    Pack years: 16.00    Types: Cigarettes  . Smokeless tobacco: Never Used  Vaping Use  . Vaping Use: Never used  Substance and Sexual  Activity  . Alcohol use: Yes    Comment: rarely, wine   . Drug use: No  . Sexual activity: Not on file  Other Topics Concern  . Not on file  Social History Narrative   Lives at home with spouse   Left handed   Caffeine: 2 cups a day   Social Determinants of Health   Financial Resource  Strain: Not on file  Food Insecurity: Not on file  Transportation Needs: Not on file  Physical Activity: Not on file  Stress: Not on file  Social Connections: Not on file  Intimate Partner Violence: Not on file    Family History  Problem Relation Age of Onset  . Colon cancer Father   . Hypertension Father   . Pulmonary fibrosis Father   . COPD Mother   . Arrhythmia Mother   . Stroke Maternal Grandmother   . Heart attack Maternal Grandfather   . Ovarian cancer Paternal Grandmother   . Breast cancer Neg Hx   . Rectal cancer Neg Hx     Past Medical History:  Diagnosis Date  . Allergy   . Anxiety   . Cold 12/2013   cough  . Colonic polyp   . Constipation   . Depression   . Family history of anesthesia complication    mother N/V  . Headache(784.0)    due to neck  . Hyperlipidemia   . IBS (irritable bowel syndrome)   . Lumbar disc disease     Patient Active Problem List   Diagnosis Date Noted  . Cervical spondylosis without myelopathy 01/01/2014  . Cervical spondylosis 01/01/2014  . URI 08/31/2008  . SINUSITIS 06/02/2008  . DEPRESSION 11/25/2007  . GASTROESOPHAGEAL REFLUX DISEASE 11/25/2007  . IRRITABLE BOWEL SYNDROME 11/25/2007  . COLONIC POLYPS, HYPERPLASTIC 12/26/2001  . HIATAL HERNIA 12/26/2001    Past Surgical History:  Procedure Laterality Date  . ABDOMINAL HYSTERECTOMY    . ANTERIOR CERVICAL DECOMP/DISCECTOMY FUSION N/A 01/01/2014   Procedure: ANTERIOR CERVICAL DECOMPRESSION/DISCECTOMY FUSION 2 LEVELS;  Surgeon: Marybelle Killings, MD;  Location: [facility];  Service: Orthopedics;  Laterality: N/A;  C4-5, C5-6 Anterior Cervical Discectomy, Fusion, Allograft, Plate and Screws  . BACK SURGERY     lumbr  . BREAST SURGERY  2012   reduction  . COLONOSCOPY    . POLYPECTOMY    . REDUCTION MAMMAPLASTY Bilateral   . TONSILLECTOMY      Current Outpatient Medications  Medication Sig Dispense Refill  . ALPRAZolam (XANAX) 0.5 MG tablet     . b complex vitamins tablet  Take 1 tablet by mouth daily.    . hydrocortisone 2.5 % ointment Apply topically daily in the afternoon.    . methylPREDNISolone (MEDROL DOSEPAK) 4 MG TBPK tablet Take pills daily together with food for 6 days. 6-5-4-3-2-1 21 tablet 1  . montelukast (SINGULAIR) 10 MG tablet Take 10 mg by mouth every morning.    . pantoprazole (PROTONIX) 40 MG tablet Take 1 tablet (40 mg total) by mouth daily. 90 tablet 3  . Pramoxine-HC (HYDROCORTISONE ACE-PRAMOXINE) 2.5-1 % CREA Apply topically daily.    . rosuvastatin (CRESTOR) 20 MG tablet Take 20 mg by mouth daily.     No current facility-administered medications for this visit.    Allergies as of 11/29/2020 - Review Complete 11/29/2020  Allergen Reaction Noted  . Cymbalta [duloxetine hcl]  11/29/2020  . Latex  03/17/2015  . Morphine and related Itching 11/29/2020    Vitals: BP 109/70 (BP Location: Right Arm,  Patient Position: Sitting)   Pulse 83   Ht 5\' 5"  (1.651 m)   Wt 199 lb (90.3 kg)   BMI 33.12 kg/m  Last Weight:  Wt Readings from Last 1 Encounters:  11/29/20 199 lb (90.3 kg)   Last Height:   Ht Readings from Last 1 Encounters:  11/29/20 5\' 5"  (1.651 m)     Physical exam: Exam: Gen: NAD, conversant, well nourised, obese, well groomed                     CV: RRR, no MRG. No Carotid Bruits. No peripheral edema, warm, nontender Eyes: Conjunctivae clear without exudates or hemorrhage  Neuro: Detailed Neurologic Exam  Speech:    Speech is normal; fluent and spontaneous with normal comprehension.  Cognition:    The patient is oriented to person, place, and time;     recent and remote memory intact;     language fluent;     normal attention, concentration,     fund of knowledge Cranial Nerves:    The pupils are equal, round, and reactive to light.  Pupils too small to visualize fundi.  Visual fields are full to finger confrontation. Extraocular movements are intact. Trigeminal sensation is intact and the muscles of mastication  are normal. The face is symmetric. The palate elevates in the midline. Hearing intact. Voice is normal. Shoulder shrug is normal. The tongue has normal motion without fasciculations.   Coordination:    No dysmetria or ataxia   Gait:    Normal native gait  Motor Observation:    No asymmetry, no atrophy, and no involuntary movements noted. Tone:    Normal muscle tone.    Posture:    Posture is normal. normal erect    Strength:    Strength is V/V in the upper and lower limbs.      Sensation: intact to LT     Reflex Exam:  DTR's:    Deep tendon reflexes in the upper and lower extremities are normal bilaterally.   Toes:    The toes are downgoing bilaterally.   Clonus:    Clonus is absent.    Assessment/Plan: 60 year old patient with acute onset hearing loss and ringing in the ears in the setting of bilateral ear infections.  We really should have an MRI of the brain to check for any serious intracranial disorders as stated below.  However patient was treated with Augmentin which may have caused hearing damage, may be from the bilateral infections, could also be effusions possibly from allergies (she has a feeling of pressure behind the ears), we will also try a week of steroids to see if this helps in any way.  MRI of the brain w/wo contrast with thin cuts through the IAC: MRI brain due to concerning symptoms of new onset headaches, hearing loss, tinnitus, ear fullness  to look for space occupying mass, chiari or intracranial hypertension (pseudotumor), schwannoma, viral inflammation of cranial nerve,  or other  May consider Lumbar puncture(for opening pressure), and may consider dedicated imaging of blood vessels in the future based on results.   Try 6 days of steroids (could this be effusions from allergies?).  Orders Placed This Encounter  Procedures  . MR BRAIN W WO CONTRAST  . Basic Metabolic Panel   Meds ordered this encounter  Medications  . methylPREDNISolone (MEDROL  DOSEPAK) 4 MG TBPK tablet    Sig: Take pills daily together with food for 6 days. 6-5-4-3-2-1  Dispense:  21 tablet    Refill:  1    Cc: Shirline Frees, MD,  Shirline Frees, MD  Sarina Ill, MD  [facility] [address] [place], [place] [address]  Phone [phone] Fax [phone]

## 2020-12-01 ENCOUNTER — Other Ambulatory Visit: Payer: Self-pay | Admitting: Neurology

## 2020-12-01 ENCOUNTER — Telehealth: Payer: Self-pay | Admitting: Neurology

## 2020-12-01 MED ORDER — ALPRAZOLAM 0.25 MG PO TABS: ORAL_TABLET | ORAL | 0 refills | Status: DC

## 2020-12-01 NOTE — Telephone Encounter (Signed)
[employer] fed auth: [employer] spoke to Georgeanna Lea ref # [id number] lvm for pt to call back to schedule

## 2020-12-01 NOTE — Telephone Encounter (Signed)
Gladly, xanax sent in thanks

## 2020-12-01 NOTE — Telephone Encounter (Signed)
no to the covid questions MR Brain w/wo contrast Dr. Ihor Dow Fed auth: [place] Ref # [id number]. Patient is scheduled at [facility] for 12/20/20 the patient requested that day.   She also informed me she is claustrophc and would like something to help her. She is aware to have a driver.

## 2020-12-05 NOTE — Telephone Encounter (Signed)
Noted, thank you

## 2020-12-20 ENCOUNTER — Ambulatory Visit: Payer: Federal, State, Local not specified - PPO

## 2020-12-20 ENCOUNTER — Other Ambulatory Visit: Payer: Self-pay

## 2020-12-20 DIAGNOSIS — R519 Headache, unspecified: Secondary | ICD-10-CM

## 2020-12-20 DIAGNOSIS — H9313 Tinnitus, bilateral: Secondary | ICD-10-CM

## 2020-12-20 DIAGNOSIS — G500 Trigeminal neuralgia: Secondary | ICD-10-CM

## 2020-12-20 DIAGNOSIS — H9193 Unspecified hearing loss, bilateral: Secondary | ICD-10-CM | POA: Diagnosis not present

## 2020-12-20 DIAGNOSIS — H938X3 Other specified disorders of ear, bilateral: Secondary | ICD-10-CM

## 2020-12-20 MED ORDER — GADOBENATE DIMEGLUMINE 529 MG/ML IV SOLN
20.0000 mL | Freq: Once | INTRAVENOUS | Status: AC | PRN
  Administered 2020-12-20: 20 mL via INTRAVENOUS

## 2021-01-05 ENCOUNTER — Encounter: Payer: Self-pay | Admitting: Internal Medicine

## 2021-01-05 ENCOUNTER — Ambulatory Visit (AMBULATORY_SURGERY_CENTER): Payer: Federal, State, Local not specified - PPO | Admitting: Internal Medicine

## 2021-01-05 ENCOUNTER — Other Ambulatory Visit: Payer: Self-pay

## 2021-01-05 VITALS — BP 105/75 | HR 78 | Temp 96.9°F | Resp 20 | Ht 65.0 in | Wt 197.0 lb

## 2021-01-05 DIAGNOSIS — R1310 Dysphagia, unspecified: Secondary | ICD-10-CM | POA: Diagnosis not present

## 2021-01-05 DIAGNOSIS — K219 Gastro-esophageal reflux disease without esophagitis: Secondary | ICD-10-CM

## 2021-01-05 DIAGNOSIS — Z800 Family history of malignant neoplasm of digestive organs: Secondary | ICD-10-CM

## 2021-01-05 DIAGNOSIS — K625 Hemorrhage of anus and rectum: Secondary | ICD-10-CM | POA: Diagnosis not present

## 2021-01-05 DIAGNOSIS — K648 Other hemorrhoids: Secondary | ICD-10-CM | POA: Diagnosis not present

## 2021-01-05 DIAGNOSIS — Z860100 Personal history of colon polyps, unspecified: Secondary | ICD-10-CM

## 2021-01-05 DIAGNOSIS — K222 Esophageal obstruction: Secondary | ICD-10-CM

## 2021-01-05 HISTORY — PX: COLONOSCOPY: SHX174

## 2021-01-05 MED ORDER — SODIUM CHLORIDE 0.9 % IV SOLN: 500.0000 mL | Freq: Once | INTRAVENOUS | Status: DC

## 2021-01-05 MED ORDER — HYDROCORTISONE ACETATE 25 MG RE SUPP: RECTAL | 0 refills | Status: DC

## 2021-01-05 NOTE — Op Note (Signed)
[facility] Patient Name: Stephanie Deleon Procedure Date: 01/05/2021 7:14 AM MRN: [id number] Endoscopist: Docia Chuck. Henrene Pastor , MD Age: 60 Referring MD:  Date of Birth: December 01, 1960 Gender: Female Account #: [id number] Procedure:                Upper GI endoscopy with balloon dilation of the                            esophagus. 20 mm max Indications:              Dysphagia, Esophageal reflux Medicines:                Monitored Anesthesia Care Procedure:                Pre-Anesthesia Assessment:                           - Prior to the procedure, a History and Physical                            was performed, and patient medications and                            allergies were reviewed. The patient's tolerance of                            previous anesthesia was also reviewed. The risks                            and benefits of the procedure and the sedation                            options and risks were discussed with the patient.                            All questions were answered, and informed consent                            was obtained. Prior Anticoagulants: The patient has                            taken no previous anticoagulant or antiplatelet                            agents. ASA Grade Assessment: II - A patient with                            mild systemic disease. After reviewing the risks                            and benefits, the patient was deemed in                            satisfactory condition to undergo the procedure.  After obtaining informed consent, the endoscope was                            passed under direct vision. Throughout the                            procedure, the patient's blood pressure, pulse, and                            oxygen saturations were monitored continuously. The                            Olympus CF-[id number] was introduced                            through the mouth, and  advanced to the second part                            of duodenum. The Endoscope was introduced through                            the mouth, and advanced to the second part of                            duodenum. The upper GI endoscopy was accomplished                            without difficulty. The patient tolerated the                            procedure well. Scope In: Scope Out: Findings:                 One benign-appearing, intrinsic moderate stenosis                            was found 38 cm from the incisors. After completing                            the endoscopic survey an attempt at passing a 86                            [place] met with pharyngeal resistance.                            Evaluation of the posterior pharynx (image)                            revealed minor trauma. Thereafter, a TTS dilator                            was passed through the scope. Dilation with an  18-19-20 mm balloon dilator was performed to 20 mm.                           The stomach was normal, save small hiatal hernia.                           The examined duodenum was normal.                           The cardia and gastric fundus were normal on                            retroflexion. Complications:            No immediate complications. Estimated Blood Loss:     Estimated blood loss: none. Impression:               - Benign-appearing esophageal stenosis. Dilated.                           - Normal stomach.                           - Normal examined duodenum.                           - No specimens collected. Recommendation:           - Patient has a contact number available for                            emergencies. The signs and symptoms of potential                            delayed complications were discussed with the                            patient. Return to normal activities tomorrow.                            Written discharge  instructions were provided to the                            patient.                           - Post dilation diet.                           - Continue present medications.                           - Office follow-up with Dr. Henrene Pastor in 6 weeks Docia Chuck. Henrene Pastor, MD 01/05/2021 9:23:56 AM This report has been signed electronically.

## 2021-01-05 NOTE — Progress Notes (Signed)
VS taken by S.B. 

## 2021-01-05 NOTE — Progress Notes (Signed)
Called to room to assist during endoscopic procedure.  Patient ID and intended procedure confirmed with present staff. Received instructions for my participation in the procedure from the performing physician.  

## 2021-01-05 NOTE — Op Note (Signed)
[facility] Patient Name: Stephanie Deleon Procedure Date: 01/05/2021 7:25 AM MRN: [id number] Endoscopist: Docia Chuck. Henrene Pastor , MD Age: 60 Referring MD:  Date of Birth: [birth year] Gender: Female Account #: [id number] Procedure:                Colonoscopy Indications:              Rectal bleeding. Family history of colon cancer in                            parent (72s). Personal history of multiple                            adenomatous and sessile serrated polyps. Previous                            examinations 2003, 2011, 2019 Medicines:                Monitored Anesthesia Care Procedure:                Pre-Anesthesia Assessment:                           - Prior to the procedure, a History and Physical                            was performed, and patient medications and                            allergies were reviewed. The patient's tolerance of                            previous anesthesia was also reviewed. The risks                            and benefits of the procedure and the sedation                            options and risks were discussed with the patient.                            All questions were answered, and informed consent                            was obtained. Prior Anticoagulants: The patient has                            taken no previous anticoagulant or antiplatelet                            agents. After reviewing the risks and benefits, the                            patient was deemed in satisfactory condition to  undergo the procedure.                           After obtaining informed consent, the colonoscope                            was passed under direct vision. Throughout the                            procedure, the patient's blood pressure, pulse, and                            oxygen saturations were monitored continuously. The                            Olympus CF-[id number] was introduced                             through the anus and advanced to the the cecum,                            identified by appendiceal orifice and ileocecal                            valve. The ileocecal valve, appendiceal orifice,                            and rectum were photographed. The quality of the                            bowel preparation was excellent. The colonoscopy                            was performed without difficulty. The patient                            tolerated the procedure well. The bowel preparation                            used was Prepopik via split dose instruction. Scope In: 8:33:47 AM Scope Out: 8:51:10 AM Scope Withdrawal Time: 0 hours 13 minutes 1 second  Total Procedure Duration: 0 hours 17 minutes 23 seconds  Findings:                 Internal hemorrhoids were found during                            retroflexion. The hemorrhoids were moderate.                           The exam was otherwise without abnormality on                            direct and retroflexion views. Complications:            No  immediate complications. Estimated blood loss:                            None. Estimated Blood Loss:     Estimated blood loss: none. Impression:               - Internal hemorrhoids.                           - The examination was otherwise normal on direct                            and retroflexion views.                           - No specimens collected. Recommendation:           - Repeat colonoscopy in 5 years for surveillance.                           - Patient has a contact number available for                            emergencies. The signs and symptoms of potential                            delayed complications were discussed with the                            patient. Return to normal activities tomorrow.                            Written discharge instructions were provided to the                            patient.                            - Resume previous diet.                           - Continue present medications.                           - Daily fiber supplement in the form of Metamucil 1                            or 2 tablespoons daily. This will improve bowel                            consistency and take pressure off the hemorrhoid                            plexus                           - Prescribe Anusol HC suppositories (or  equivalent); #30; 1 per rectum at night for 1 week                            or as needed. This will help with hemorrhoidal                            swelling Docia Chuck. Henrene Pastor, MD 01/05/2021 9:19:34 AM This report has been signed electronically.

## 2021-01-05 NOTE — Patient Instructions (Signed)
Handouts given for Esophagitis and stricture, Post-dilation diet.  Hemorrhoids and High Fiber diet.  Take a daily fiber supplement like Metamucil 1-2 tbsp daily to improve bowel consistency and help with pressure.  Pick up your new prescription at the pharmacy.  Gargle with salt water or use Cepacol spray for sore throat.   YOU HAD AN ENDOSCOPIC PROCEDURE TODAY AT [facility]:   Refer to the procedure report that was given to you for any specific questions about what was found during the examination.  If the procedure report does not answer your questions, please call your gastroenterologist to clarify.  If you requested that your care partner not be given the details of your procedure findings, then the procedure report has been included in a sealed envelope for you to review at your convenience later.  YOU SHOULD EXPECT: Some feelings of bloating in the abdomen. Passage of more gas than usual.  Walking can help get rid of the air that was put into your GI tract during the procedure and reduce the bloating. If you had a lower endoscopy (such as a colonoscopy or flexible sigmoidoscopy) you may notice spotting of blood in your stool or on the toilet paper. If you underwent a bowel prep for your procedure, you may not have a normal bowel movement for a few days.  Please Note:  You might notice some irritation and congestion in your nose or some drainage.  This is from the oxygen used during your procedure.  There is no need for concern and it should clear up in a day or so.  SYMPTOMS TO REPORT IMMEDIATELY:   Following lower endoscopy (colonoscopy or flexible sigmoidoscopy):  Excessive amounts of blood in the stool  Significant tenderness or worsening of abdominal pains  Swelling of the abdomen that is new, acute  Fever of 100F or higher   Following upper endoscopy (EGD)  Vomiting of blood or coffee ground material  New chest pain or pain under the shoulder blades  Painful  or persistently difficult swallowing  New shortness of breath  Fever of 100F or higher  Black, tarry-looking stools  For urgent or emergent issues, a gastroenterologist can be reached at any hour by calling [phone]. Do not use MyChart messaging for urgent concerns.    DIET:  We do recommend a small meal at first, but then you may proceed to your regular diet.  Drink plenty of fluids but you should avoid alcoholic beverages for 24 hours.  ACTIVITY:  You should plan to take it easy for the rest of today and you should NOT DRIVE or use heavy machinery until tomorrow (because of the sedation medicines used during the test).    FOLLOW UP: Our staff will call the number listed on your records 48-72 hours following your procedure to check on you and address any questions or concerns that you may have regarding the information given to you following your procedure. If we do not reach you, we will leave a message.  We will attempt to reach you two times.  During this call, we will ask if you have developed any symptoms of COVID 19. If you develop any symptoms (ie: fever, flu-like symptoms, shortness of breath, cough etc.) before then, please call [phone].  If you test positive for Covid 19 in the 2 weeks post procedure, please call and report this information to [place].    If any biopsies were taken you will be contacted by phone or by letter within  the next 1-3 weeks.  Please call us at [phone] if you have not heard about the biopsies in 3 weeks.    SIGNATURES/CONFIDENTIALITY: You and/or your care partner have signed paperwork which will be entered into your electronic medical record.  These signatures attest to the fact that that the information above on your After Visit Summary has been reviewed and is understood.  Full responsibility of the confidentiality of this discharge information lies with you and/or your care-partner.

## 2021-01-05 NOTE — Progress Notes (Signed)
A and O x3. Report to RN. Tolerated MAC anesthesia well.Teeth unchanged after procedure.

## 2021-01-06 ENCOUNTER — Encounter: Payer: Self-pay | Admitting: Internal Medicine

## 2021-01-09 ENCOUNTER — Telehealth: Payer: Self-pay

## 2021-01-09 NOTE — Telephone Encounter (Signed)
  Follow up Call-  Call back number 01/05/2021 09/18/2018  Post procedure Call Back phone  # [phone]  Permission to leave phone message Yes Yes  Some recent data might be hidden     Patient questions:  Do you have a fever, pain , or abdominal swelling? No. Pain Score  0 *  Have you tolerated food without any problems? Yes.    Have you been able to return to your normal activities? Yes.    Do you have any questions about your discharge instructions: Diet   No. Medications  No. Follow up visit  No.  Do you have questions or concerns about your Care? No.   Pt. Reports she still has a sore throat, but it is feeling better daily.  Told pt. That sounded fine, as long as it continues to improve, and that she would have her  F/U apptmt with Dr. Henrene Pastor in a few weeks to further monitor how she is managing. Actions: * If pain score is 4 or above: No action needed, pain <4.

## 2021-02-03 ENCOUNTER — Other Ambulatory Visit: Payer: Self-pay | Admitting: Family Medicine

## 2021-02-03 DIAGNOSIS — Z1231 Encounter for screening mammogram for malignant neoplasm of breast: Secondary | ICD-10-CM

## 2021-02-15 ENCOUNTER — Ambulatory Visit: Payer: Federal, State, Local not specified - PPO | Admitting: Orthopaedic Surgery

## 2021-02-15 ENCOUNTER — Ambulatory Visit (INDEPENDENT_AMBULATORY_CARE_PROVIDER_SITE_OTHER): Payer: Federal, State, Local not specified - PPO

## 2021-02-15 ENCOUNTER — Encounter: Payer: Self-pay | Admitting: Orthopaedic Surgery

## 2021-02-15 VITALS — BP 112/79 | HR 79 | Ht 65.0 in | Wt 199.0 lb

## 2021-02-15 DIAGNOSIS — M7711 Lateral epicondylitis, right elbow: Secondary | ICD-10-CM | POA: Diagnosis not present

## 2021-02-15 DIAGNOSIS — M25521 Pain in right elbow: Secondary | ICD-10-CM | POA: Diagnosis not present

## 2021-02-15 NOTE — Progress Notes (Signed)
Office Visit Note   Patient: Stephanie Deleon           Date of Birth: 07/30/1961           MRN: [id number] Visit Date: 02/15/2021              Requested by: Shirline Frees, MD [address] [place],  [place] [id number] PCP: Shirline Frees, MD   Assessment & Plan: Visit Diagnoses:  1. Pain in right elbow   2. Lateral epicondylitis, right elbow     Plan: Tennis elbow injection performed.  Tennis elbow brace applied pathophysiology discussed.  She will return if she is having ongoing symptoms.  Follow-Up Instructions: No follow-ups on file.   Orders:  Orders Placed This Encounter  Procedures  . XR Elbow Complete Right (3+View)   No orders of the defined types were placed in this encounter.     Procedures: Medium Joint Inj: R lateral epicondyle on 02/15/2021 9:44 AM Details: lateral approach Medications: 0.5 mL lidocaine 1 %; 1 mL bupivacaine 0.5 %; 40 mg methylPREDNISolone acetate 40 MG/ML      Clinical Data: No additional findings.   Subjective: Chief Complaint  Patient presents with  . Right Elbow - Pain    HPI 60 year old female did some yard work several weeks ago had increased pain in her right elbow.  It radiates up her arm and down toward her wrist.  She is left-hand dominant.  Previous cervical fusion 2015 two-level.  Occasionally her neck spinal stiff but she not had radicular symptoms like she had preoperatively.  She has noticed pain with gripping squeezing.  She had a brace she wore her elbow but was above her elbow and she has some ecchymosis medially proximal to the medial epicondyle.  Review of Systems positive for C5-C7 two-level cervical fusion 2015.  Positive for IBS in the past all of systems noncontributory to HPI.   Objective: Vital Signs: BP 112/79   Pulse 79   Ht 5\' 5"  (1.651 m)   Wt 199 lb (90.3 kg)   BMI 33.12 kg/m   Physical Exam Constitutional:      Appearance: She is well-developed.  HENT:     Head:  Normocephalic.     Right Ear: External ear normal.     Left Ear: External ear normal.  Eyes:     Pupils: Pupils are equal, round, and reactive to light.  Neck:     Thyroid: No thyromegaly.     Trachea: No tracheal deviation.  Cardiovascular:     Rate and Rhythm: Normal rate.  Pulmonary:     Effort: Pulmonary effort is normal.  Abdominal:     Palpations: Abdomen is soft.  Skin:    General: Skin is warm and dry.  Neurological:     Mental Status: She is alert and oriented to person, place, and time.  Psychiatric:        Behavior: Behavior normal.     Ortho Exam patient exquisite tenderness to lateral epicondyle origin no ecchymosis.  No palpable gap.  Pain with resisted wrist extension flexion spinal upper extremity atrophy no brachial plexus tenderness.  Negative cervical compression well-healed cervical incision.  Normal heel toe gait.  Specialty Comments:  No specialty comments available.  Imaging: XR Elbow Complete Right (3+View)  Result Date: 02/15/2021 AP lateral right elbow x-rays are obtained and reviewed.  This shows no degenerative changes negative for acute bone changes.  No soft tissue calcification over the medial lateral epicondyle.  Impression: Normal right elbow radiographs.    PMFS History: Patient Active Problem List   Diagnosis Date Noted  . Lateral epicondylitis, right elbow 02/15/2021  . Cervical spondylosis without myelopathy 01/01/2014    Class: Diagnosis of  . Cervical spondylosis 01/01/2014  . URI 08/31/2008  . SINUSITIS 06/02/2008  . DEPRESSION 11/25/2007  . GASTROESOPHAGEAL REFLUX DISEASE 11/25/2007  . IRRITABLE BOWEL SYNDROME 11/25/2007  . COLONIC POLYPS, HYPERPLASTIC 12/26/2001  . HIATAL HERNIA 12/26/2001   Past Medical History:  Diagnosis Date  . Allergy   . Anxiety   . Cold 12/2013   cough  . Colonic polyp   . Constipation   . Depression   . Family history of anesthesia complication    mother N/V  . Headache(784.0)    due to neck   . Hyperlipidemia   . IBS (irritable bowel syndrome)   . Lumbar disc disease     Family History  Problem Relation Age of Onset  . Colon cancer Father   . Hypertension Father   . Pulmonary fibrosis Father   . COPD Mother   . Arrhythmia Mother   . Stroke Maternal Grandmother   . Heart attack Maternal Grandfather   . Ovarian cancer Paternal Grandmother   . Breast cancer Neg Hx   . Rectal cancer Neg Hx   . Stomach cancer Neg Hx   . Esophageal cancer Neg Hx     Past Surgical History:  Procedure Laterality Date  . ABDOMINAL HYSTERECTOMY    . ANTERIOR CERVICAL DECOMP/DISCECTOMY FUSION N/A 01/01/2014   Procedure: ANTERIOR CERVICAL DECOMPRESSION/DISCECTOMY FUSION 2 LEVELS;  Surgeon: Marybelle Killings, MD;  Location: [facility];  Service: Orthopedics;  Laterality: N/A;  C4-5, C5-6 Anterior Cervical Discectomy, Fusion, Allograft, Plate and Screws  . BACK SURGERY     lumbr  . BREAST SURGERY  2012   reduction  . COLONOSCOPY  01/05/2021   09/18/18, 2011, 2003  . POLYPECTOMY    . REDUCTION MAMMAPLASTY Bilateral   . TONSILLECTOMY     Social History   Occupational History  . Not on file  Tobacco Use  . Smoking status: Current Every Day Smoker    Packs/day: 0.50    Years: 32.00    Pack years: 16.00    Types: Cigarettes  . Smokeless tobacco: Never Used  Vaping Use  . Vaping Use: Never used  Substance and Sexual Activity  . Alcohol use: Yes    Comment: rarely, wine   . Drug use: No  . Sexual activity: Not on file

## 2021-02-20 MED ORDER — LIDOCAINE HCL 1 % IJ SOLN
0.5000 mL | INTRAMUSCULAR | Status: AC | PRN
  Administered 2021-02-15: .5 mL

## 2021-02-20 MED ORDER — BUPIVACAINE HCL 0.5 % IJ SOLN
1.0000 mL | INTRAMUSCULAR | Status: AC | PRN
  Administered 2021-02-15: 1 mL via INTRA_ARTICULAR

## 2021-02-20 MED ORDER — METHYLPREDNISOLONE ACETATE 40 MG/ML IJ SUSP
40.0000 mg | INTRAMUSCULAR | Status: AC | PRN
  Administered 2021-02-15: 40 mg via INTRA_ARTICULAR

## 2021-02-27 ENCOUNTER — Emergency Department
Admission: RE | Admit: 2021-02-27 | Discharge: 2021-02-27 | Disposition: A | Discharge status: Home / Self Care | Payer: Federal, State, Local not specified - PPO | Source: Ambulatory Visit

## 2021-02-27 ENCOUNTER — Other Ambulatory Visit: Payer: Self-pay

## 2021-02-27 VITALS — BP 98/67 | HR 83 | Temp 98.0°F | Resp 17

## 2021-02-27 DIAGNOSIS — J3489 Other specified disorders of nose and nasal sinuses: Secondary | ICD-10-CM

## 2021-02-27 DIAGNOSIS — J0100 Acute maxillary sinusitis, unspecified: Secondary | ICD-10-CM

## 2021-02-27 DIAGNOSIS — R059 Cough, unspecified: Secondary | ICD-10-CM

## 2021-02-27 MED ORDER — PREDNISONE 20 MG PO TABS: ORAL_TABLET | ORAL | 0 refills | Status: DC

## 2021-02-27 MED ORDER — AMOXICILLIN-POT CLAVULANATE 875-125 MG PO TABS: 1.0000 | ORAL_TABLET | Freq: Two times a day (BID) | ORAL | 0 refills | Status: DC

## 2021-02-27 MED ORDER — BENZONATATE 200 MG PO CAPS: 200.0000 mg | ORAL_CAPSULE | Freq: Three times a day (TID) | ORAL | 0 refills | Status: AC | PRN

## 2021-02-27 MED ORDER — FEXOFENADINE HCL 180 MG PO TABS: 180.0000 mg | ORAL_TABLET | Freq: Every day | ORAL | 0 refills | Status: DC

## 2021-02-27 NOTE — ED Triage Notes (Signed)
Sinus congestion w/ facial pain & pressure x 10 days Cough x 10 days OTC flonase, tylenol, nyquil, guaifenesin- min relief Moderna booster 10/2020

## 2021-02-27 NOTE — Discharge Instructions (Addendum)
Advised/instructed patient to take medications as prescribed with food to completion.  Advised patient to take Allegra 180 mg daily for the next 5 to 7 days then as needed.  Advised/instructed patient to start prednisone burst tomorrow morning, Tuesday, 02/24/2021.  Advised patient may use Tessalon Perles daily, as needed

## 2021-02-27 NOTE — ED Provider Notes (Signed)
[facility] C[facility]    CSN: [id number] Arrival date & time: 02/27/21  1646      History   Chief Complaint Chief Complaint  Patient presents with  . 5pm appt  . sinus congestion   . Cough    HPI Stephanie Deleon is a 60 y.o. female.   HPI 60 year old female presents with sinus nasal congestion, sinus pressure, and cough for 10 days.  Reports OTC Flonase, Tylenol, NyQuil, guaifenesin provided little to no relief.  Past Medical History:  Diagnosis Date  . Allergy   . Anxiety   . Cold 12/2013   cough  . Colonic polyp   . Constipation   . Depression   . Family history of anesthesia complication    mother N/V  . Headache(784.0)    due to neck  . Hyperlipidemia   . IBS (irritable bowel syndrome)   . Lumbar disc disease     Patient Active Problem List   Diagnosis Date Noted  . Lateral epicondylitis, right elbow 02/15/2021  . Cervical spondylosis without myelopathy 01/01/2014    Class: Diagnosis of  . Cervical spondylosis 01/01/2014  . URI 08/31/2008  . SINUSITIS 06/02/2008  . DEPRESSION 11/25/2007  . GASTROESOPHAGEAL REFLUX DISEASE 11/25/2007  . IRRITABLE BOWEL SYNDROME 11/25/2007  . COLONIC POLYPS, HYPERPLASTIC 12/26/2001  . HIATAL HERNIA 12/26/2001    Past Surgical History:  Procedure Laterality Date  . ABDOMINAL HYSTERECTOMY    . ANTERIOR CERVICAL DECOMP/DISCECTOMY FUSION N/A 01/01/2014   Procedure: ANTERIOR CERVICAL DECOMPRESSION/DISCECTOMY FUSION 2 LEVELS;  Surgeon: Marybelle Killings, MD;  Location: [place];  Service: Orthopedics;  Laterality: N/A;  C4-5, C5-6 Anterior Cervical Discectomy, Fusion, Allograft, Plate and Screws  . BACK SURGERY     lumbr  . BREAST SURGERY  2012   reduction  . COLONOSCOPY  01/05/2021   09/18/18, 2011, 2003  . POLYPECTOMY    . REDUCTION MAMMAPLASTY Bilateral   . TONSILLECTOMY      OB History   No obstetric history on file.      Home Medications    Prior to Admission medications   Medication Sig Start Date End Date  Taking? Authorizing Provider  amoxicillin-clavulanate (AUGMENTIN) 875-125 MG tablet Take 1 tablet by mouth every 12 (twelve) hours. 02/27/21  Yes Eliezer Lofts, FNP  benzonatate (TESSALON) 200 MG capsule Take 1 capsule (200 mg total) by mouth 3 (three) times daily as needed for up to 7 days for cough. 02/27/21 03/06/21 Yes Eliezer Lofts, FNP  fexofenadine [facility] ALLERGY) 180 MG tablet Take 1 tablet (180 mg total) by mouth daily for 15 days. 02/27/21 03/14/21 Yes Eliezer Lofts, FNP  pantoprazole (PROTONIX) 40 MG tablet Take 1 tablet (40 mg total) by mouth daily. 11/07/20  Yes Irene Shipper, MD  predniSONE (DELTASONE) 20 MG tablet Take 3 tabs PO daily x 5 days. 02/27/21  Yes Eliezer Lofts, FNP  rosuvastatin (CRESTOR) 20 MG tablet Take 20 mg by mouth daily.   Yes [provider]  ALPRAZolam (XANAX) 0.25 MG tablet Take 1-2 tabs (0.25mg -0.50mg ) 30-60 minutes before procedure. May repeat if needed.Do not drive. 12/01/20   Melvenia Beam, MD  b complex vitamins tablet Take 1 tablet by mouth daily.    [provider]  hydrocortisone (ANUSOL-HC) 25 MG suppository Anusol suppositories, 1 per rectum at night for 1 week or as needed. 01/05/21   Irene Shipper, MD  hydrocortisone 2.5 % ointment Apply topically daily in the afternoon.    [provider]  montelukast (SINGULAIR)  10 MG tablet Take 10 mg by mouth every morning. Patient not taking: Reported on 02/27/2021    [provider]  Pramoxine-HC (HYDROCORTISONE ACE-PRAMOXINE) 2.5-1 % CREA Apply topically daily.    [provider]    Family History Family History  Problem Relation Age of Onset  . Colon cancer Father   . Hypertension Father   . Pulmonary fibrosis Father   . COPD Mother   . Arrhythmia Mother   . Stroke Maternal Grandmother   . Heart attack Maternal Grandfather   . Ovarian cancer Paternal Grandmother   . Breast cancer Neg Hx   . Rectal cancer Neg Hx   . Stomach cancer Neg Hx   . Esophageal  cancer Neg Hx     Social History Social History   Tobacco Use  . Smoking status: Current Every Day Smoker    Packs/day: 0.50    Years: 32.00    Pack years: 16.00    Types: Cigarettes  . Smokeless tobacco: Never Used  Vaping Use  . Vaping Use: Never used  Substance Use Topics  . Alcohol use: Yes    Comment: rarely, wine   . Drug use: No     Allergies   Cymbalta [duloxetine hcl], Latex, and Morphine and related   Review of Systems Review of Systems  Constitutional: Negative.   HENT: Positive for congestion, sinus pressure and sinus pain.   Eyes: Negative.   Respiratory: Positive for cough.   Cardiovascular: Negative.   Gastrointestinal: Negative.   Musculoskeletal: Negative.   Skin: Negative.   Neurological: Negative.      Physical Exam Triage Vital Signs ED Triage Vitals  Enc Vitals Group     BP 02/27/21 1821 98/67     Pulse Rate 02/27/21 1821 83     Resp 02/27/21 1821 17     Temp 02/27/21 1821 98 F (36.7 C)     Temp Source 02/27/21 1821 Oral     SpO2 02/27/21 1821 97 %     Weight --      Height --      Head Circumference --      Peak Flow --      Pain Score 02/27/21 1823 4     Pain Loc --      Pain Edu? --      Excl. in [place]? --    No data found.  Updated Vital Signs BP 98/67 (BP Location: Right Arm)   Pulse 83   Temp 98 F (36.7 C) (Oral)   Resp 17   SpO2 97%   Visual Acuity Right Eye Distance:   Left Eye Distance:   Bilateral Distance:    Right Eye Near:   Left Eye Near:    Bilateral Near:     Physical Exam Vitals and nursing note reviewed.  Constitutional:      General: She is not in acute distress.    Appearance: Normal appearance. She is obese. She is ill-appearing.  HENT:     Head: Normocephalic and atraumatic.     Right Ear: Tympanic membrane is retracted.     Left Ear: Tympanic membrane is retracted.     Ears:     Comments: ETD noted bilaterally    Nose:     Right Turbinates: Enlarged.     Left Turbinates: Enlarged.      Right Sinus: Maxillary sinus tenderness present.     Left Sinus: Maxillary sinus tenderness present.     Comments: Turbinates are erythematous bilaterally  Mouth/Throat:     Lips: Pink.     Mouth: Mucous membranes are moist.     Pharynx: Oropharynx is clear. Uvula midline. No pharyngeal swelling, posterior oropharyngeal erythema or uvula swelling.     Comments: Moderate clear drainage of posterior oropharynx noted Cardiovascular:     Rate and Rhythm: Normal rate and regular rhythm.     Pulses: Normal pulses.     Heart sounds: Normal heart sounds.  Pulmonary:     Effort: Pulmonary effort is normal.     Breath sounds: Normal breath sounds. No wheezing, rhonchi or rales.     Comments: Infrequent nonproductive cough noted on exam Musculoskeletal:        General: Normal range of motion.     Cervical back: Normal range of motion and neck supple. No tenderness.  Lymphadenopathy:     Cervical: No cervical adenopathy.  Skin:    General: Skin is warm and dry.  Neurological:     General: No focal deficit present.     Mental Status: She is oriented to person, place, and time.  Psychiatric:        Mood and Affect: Mood normal.        Behavior: Behavior normal.      UC Treatments / Results  Labs (all labs ordered are listed, but only abnormal results are displayed) Labs Reviewed - No data to display  EKG   Radiology No results found.  Procedures Procedures (including critical care time)  Medications Ordered in UC Medications - No data to display  Initial Impression / Assessment and Plan / UC Course  I have reviewed the triage vital signs and the nursing notes.  Pertinent labs & imaging results that were available during my care of the patient were reviewed by me and considered in my medical decision making (see chart for details).     MDM: 1.  Acute maxillary sinusitis, 2.  Sinus pressure, 3.  Cough, 4.  Allergic rhinitis.  Patient discharged home, hemodynamically  stable. Final Clinical Impressions(s) / UC Diagnoses   Final diagnoses:  Acute maxillary sinusitis, recurrence not specified  Sinus pressure  Cough     Discharge Instructions     Advised/instructed patient to take medicationc as prescribed with food to completion.  Advised patient to take Allegra 180 mg daily for the next 5 to 7 days then as needed.  Advised/instructed patient to start prednisone burst tomorrow morning, Tuesday, 02/24/2021.  Advised patient may use Tessalon Perles daily, as needed    ED Prescriptions    Medication Sig Dispense Auth. Provider   amoxicillin-clavulanate (AUGMENTIN) 875-125 MG tablet Take 1 tablet by mouth every 12 (twelve) hours. 14 tablet Eliezer Lofts, FNP   predniSONE (DELTASONE) 20 MG tablet Take 3 tabs PO daily x 5 days. 15 tablet Eliezer Lofts, FNP   fexofenadine [facility] ALLERGY) 180 MG tablet Take 1 tablet (180 mg total) by mouth daily for 15 days. 15 tablet Eliezer Lofts, FNP   benzonatate (TESSALON) 200 MG capsule Take 1 capsule (200 mg total) by mouth 3 (three) times daily as needed for up to 7 days for cough. 30 capsule Eliezer Lofts, FNP     PDMP not reviewed this encounter.   Eliezer Lofts, [place] 02/27/21 1935

## 2021-03-28 ENCOUNTER — Other Ambulatory Visit: Payer: Self-pay

## 2021-03-28 ENCOUNTER — Ambulatory Visit
Admission: RE | Admit: 2021-03-28 | Discharge: 2021-03-28 | Disposition: A | Discharge status: Home / Self Care | Payer: Federal, State, Local not specified - PPO | Source: Ambulatory Visit | Attending: Family Medicine | Admitting: Family Medicine

## 2021-03-28 DIAGNOSIS — Z1231 Encounter for screening mammogram for malignant neoplasm of breast: Secondary | ICD-10-CM

## 2021-03-29 ENCOUNTER — Ambulatory Visit: Payer: Federal, State, Local not specified - PPO | Admitting: Internal Medicine

## 2021-03-29 ENCOUNTER — Encounter: Payer: Self-pay | Admitting: Internal Medicine

## 2021-03-29 VITALS — BP 94/72 | HR 97 | Ht 65.0 in | Wt 196.0 lb

## 2021-03-29 DIAGNOSIS — K219 Gastro-esophageal reflux disease without esophagitis: Secondary | ICD-10-CM

## 2021-03-29 DIAGNOSIS — K649 Unspecified hemorrhoids: Secondary | ICD-10-CM

## 2021-03-29 DIAGNOSIS — R1310 Dysphagia, unspecified: Secondary | ICD-10-CM

## 2021-03-29 DIAGNOSIS — K625 Hemorrhage of anus and rectum: Secondary | ICD-10-CM

## 2021-03-29 DIAGNOSIS — K222 Esophageal obstruction: Secondary | ICD-10-CM

## 2021-03-29 DIAGNOSIS — R112 Nausea with vomiting, unspecified: Secondary | ICD-10-CM

## 2021-03-29 MED ORDER — PANTOPRAZOLE SODIUM 40 MG PO TBEC: 40.0000 mg | DELAYED_RELEASE_TABLET | Freq: Every day | ORAL | 3 refills | Status: AC

## 2021-03-29 MED ORDER — HYDROCORTISONE ACETATE 25 MG RE SUPP: RECTAL | 11 refills | Status: DC

## 2021-03-29 NOTE — Progress Notes (Signed)
HISTORY OF PRESENT ILLNESS:  Stephanie Deleon is a 60 y.o. female with past medical history as listed below who was evaluated November 07, 2020 regarding chronic reflux symptoms not responding to antacid use or H2 receptor antagonist therapy, intermittent solid food dysphagia, rectal bleeding with associated discomfort, a personal history of sessile serrated polyps and tubular adenomas, and a family history of colon cancer.  See that dictation.  She was placed on pantoprazole 40 mg daily.  Colonoscopy and upper endoscopy were performed January 05, 2021.  Colonoscopy revealed internal hemorrhoids.  It was recommended that she take fiber in the form of Metamucil.  She was also prescribed Anusol HC suppositories.  Upper endoscopy revealed benign distal esophageal stricture which was dilated with 20 mm balloon.  She was continued on pantoprazole.  She was asked to follow-up at this time.  Patient tells me that she has not been compliant with fiber therapy and thus has regular problems with constipation.  The Anusol suppositories have helped her symptomatic hemorrhoids, but she does request a refill.  The pantoprazole has nicely controlled her reflux symptoms.  She has had no further problems with dysphagia post dilation.  She does mention to me that she has had for many years in a situation where she will develop constipation followed by abdominal cramping and vomiting.  This happens about once per month or less.  REVIEW OF SYSTEMS:  All non-GI ROS negative as otherwise stated in the HPI except for sinus and allergy, cough, urinary leakage  Past Medical History:  Diagnosis Date   Allergy    Anxiety    Cold 12/2013   cough   Colonic polyp    Constipation    Depression    Family history of anesthesia complication    mother N/V   Headache(784.0)    due to neck   Hyperlipidemia    IBS (irritable bowel syndrome)    Lumbar disc disease     Past Surgical History:  Procedure Laterality Date   ABDOMINAL  HYSTERECTOMY     ANTERIOR CERVICAL DECOMP/DISCECTOMY FUSION N/A 01/01/2014   Procedure: ANTERIOR CERVICAL DECOMPRESSION/DISCECTOMY FUSION 2 LEVELS;  Surgeon: Marybelle Killings, MD;  Location: [place];  Service: Orthopedics;  Laterality: N/A;  C4-5, C5-6 Anterior Cervical Discectomy, Fusion, Allograft, Plate and Screws   BACK SURGERY     lumbr   BREAST SURGERY  2012   reduction   COLONOSCOPY  01/05/2021   09/18/18, 2011, 2003   POLYPECTOMY     REDUCTION MAMMAPLASTY Bilateral    TONSILLECTOMY      Social History Stephanie Deleon  reports that she has been smoking cigarettes. She has a 16.00 pack-year smoking history. She has never used smokeless tobacco. She reports current alcohol use. She reports that she does not use drugs.  family history includes Arrhythmia in her mother; COPD in her mother; Colon cancer in her father; Heart attack in her maternal grandfather; Hypertension in her father; Ovarian cancer in her paternal grandmother; Pulmonary fibrosis in her father; Stroke in her maternal grandmother.  Allergies  Allergen Reactions   Cymbalta [Duloxetine Hcl]     Almost suicidal   Latex     Blisters   Morphine And Related Itching    Itching when used at hospital/pump           PHYSICAL EXAMINATION: Vital signs: BP 94/72   Pulse 97   Ht 5\' 5"  (1.651 m)   Wt 196 lb (88.9 kg)   SpO2 95%  BMI 32.62 kg/m   Constitutional: generally well-appearing, no acute distress Psychiatric: alert and oriented x3, cooperative Eyes: Anicteric Mouth: Mask Abdomen: Not reexamined Neuro: No obvious deficits  ASSESSMENT:  1.  GERD complicated by peptic stricture.  Asymptomatic post dilation on PPI 2.  Vomiting associated with constipation and abdominal cramping.  Most consistent with a vagal response.  We discussed this in detail. 3.  Chronic constipation 4.  Symptomatic internal hemorrhoids   PLAN:  1.  Reflux precautions 2.  Continue pantoprazole 40 mg daily.  Prescription refilled.   Medication risks reviewed 3.  Refill Anusol HC suppositories. 4.  Increase fiber and water consumption 5.  Routine GI office follow-up 1 year.  Contact the office in the interim for any questions or problems A total time of 30 minutes was required preparing to see the patient, reviewing tests, obtaining history, performing medically appropriate physical exam, counseling the patient regarding her multiple above listed issues, ordering medications, and documenting clinical information in the health record

## 2021-03-29 NOTE — Patient Instructions (Signed)
We have sent the following medications to your pharmacy for you to pick up at your convenience: Pantoprazole and Anusol Suppositories  If you are age 62 or older, your body mass index should be between 23-30. Your Body mass index is 32.62 kg/m. If this is out of the aforementioned range listed, please consider follow up with your Primary Care Provider.  If you are age 90 or younger, your body mass index should be between 19-25. Your Body mass index is 32.62 kg/m. If this is out of the aformentioned range listed, please consider follow up with your Primary Care Provider.   __________________________________________________________  The [facility] providers would like to encourage you to use [facility] to communicate with providers for non-urgent requests or questions.  Due to long hold times on the telephone, sending your provider a message by [facility] may be a faster and more efficient way to get a response.  Please allow 48 business hours for a response.  Please remember that this is for non-urgent requests.   Follow up in 1 year.   Thank you for choosing me and [facility].  Dr.John Henrene Pastor

## 2021-07-08 ENCOUNTER — Emergency Department (INDEPENDENT_AMBULATORY_CARE_PROVIDER_SITE_OTHER): Payer: Federal, State, Local not specified - PPO

## 2021-07-08 ENCOUNTER — Other Ambulatory Visit: Payer: Self-pay

## 2021-07-08 ENCOUNTER — Emergency Department
Admission: EM | Admit: 2021-07-08 | Discharge: 2021-07-08 | Disposition: A | Discharge status: Home / Self Care | Payer: Federal, State, Local not specified - PPO | Source: Home / Self Care | Attending: Family Medicine | Admitting: Family Medicine

## 2021-07-08 ENCOUNTER — Encounter: Payer: Self-pay | Admitting: Emergency Medicine

## 2021-07-08 DIAGNOSIS — N390 Urinary tract infection, site not specified: Secondary | ICD-10-CM

## 2021-07-08 DIAGNOSIS — J069 Acute upper respiratory infection, unspecified: Secondary | ICD-10-CM

## 2021-07-08 DIAGNOSIS — R300 Dysuria: Secondary | ICD-10-CM

## 2021-07-08 DIAGNOSIS — R059 Cough, unspecified: Secondary | ICD-10-CM

## 2021-07-08 LAB — POCT URINALYSIS DIP (MANUAL ENTRY)
Bilirubin, UA: NEGATIVE
Glucose, UA: NEGATIVE mg/dL
Ketones, POC UA: NEGATIVE mg/dL
Nitrite, UA: NEGATIVE
Protein Ur, POC: NEGATIVE mg/dL
Spec Grav, UA: 1.025 (ref 1.010–1.025)
Urobilinogen, UA: 1 E.U./dL
pH, UA: 5.5 (ref 5.0–8.0)

## 2021-07-08 MED ORDER — SULFAMETHOXAZOLE-TRIMETHOPRIM 800-160 MG PO TABS: 1.0000 | ORAL_TABLET | Freq: Two times a day (BID) | ORAL | 0 refills | Status: AC

## 2021-07-08 MED ORDER — PHENAZOPYRIDINE HCL 200 MG PO TABS: 200.0000 mg | ORAL_TABLET | Freq: Three times a day (TID) | ORAL | 0 refills | Status: DC

## 2021-07-08 NOTE — ED Provider Notes (Signed)
[name]    CSN: [id number] Arrival date & time: 07/08/21  1235      History   Chief Complaint Chief Complaint  Patient presents with   Dysuria   Cough    HPI Stephanie Deleon is a 61 y.o. female.   HPI  Patient has been under a lot of stress lately due to severe illness in her mother.  Her mother passed away 6 days ago.  She is here because she has been "ignoring her health".  She has dysuria that is been there for 2 to 3 weeks.  It is very uncomfortable.  Some lower abdominal pressure.  No kidney pain, no nausea vomiting, no fever or chills. She also has had a cough and cold for about 3 weeks.  Has had 2 COVID test that were negative.  She states that the cold symptoms have gotten better but the cough persists.  She has a harsh cough.  It hurts to cough.  She has some pain with deep breath.  She has been feeling tired. Patient is requesting an antibiotic that will cover both the upper respiratory infection and the urinary tract infection  Past Medical History:  Diagnosis Date   Allergy    Anxiety    Cold 12/2013   cough   Colonic polyp    Constipation    Depression    Family history of anesthesia complication    mother N/V   Headache(784.0)    due to neck   Hyperlipidemia    IBS (irritable bowel syndrome)    Lumbar disc disease     Patient Active Problem List   Diagnosis Date Noted   Lateral epicondylitis, right elbow 02/15/2021   Cervical spondylosis without myelopathy 01/01/2014    Class: Diagnosis of   Cervical spondylosis 01/01/2014   URI 08/31/2008   SINUSITIS 06/02/2008   DEPRESSION 11/25/2007   GASTROESOPHAGEAL REFLUX DISEASE 11/25/2007   IRRITABLE BOWEL SYNDROME 11/25/2007   COLONIC POLYPS, HYPERPLASTIC 12/26/2001   HIATAL HERNIA 12/26/2001    Past Surgical History:  Procedure Laterality Date   ABDOMINAL HYSTERECTOMY     ANTERIOR CERVICAL DECOMP/DISCECTOMY FUSION N/A 01/01/2014   Procedure: ANTERIOR CERVICAL  DECOMPRESSION/DISCECTOMY FUSION 2 LEVELS;  Surgeon: Marybelle Killings, MD;  Location: [place];  Service: Orthopedics;  Laterality: N/A;  C4-5, C5-6 Anterior Cervical Discectomy, Fusion, Allograft, Plate and Screws   BACK SURGERY     lumbr   BREAST SURGERY  2012   reduction   COLONOSCOPY  01/05/2021   09/18/18, 2011, 2003   POLYPECTOMY     REDUCTION MAMMAPLASTY Bilateral    TONSILLECTOMY      OB History   No obstetric history on file.      Home Medications    Prior to Admission medications   Medication Sig Start Date End Date Taking? Authorizing Provider  phenazopyridine (PYRIDIUM) 200 MG tablet Take 1 tablet (200 mg total) by mouth 3 (three) times daily. 07/08/21  Yes Raylene Everts, MD  sulfamethoxazole-trimethoprim (BACTRIM DS) 800-160 MG tablet Take 1 tablet by mouth 2 (two) times daily for 7 days. 07/08/21 07/15/21 Yes Raylene Everts, MD  ALPRAZolam Duanne Moron) 0.25 MG tablet Take 1-2 tabs (0.25mg -0.50mg ) 30-60 minutes before procedure. May repeat if needed.Do not drive. Patient taking differently: as needed. 12/01/20   Melvenia Beam, MD  fexofenadine [facility] ALLERGY) 180 MG tablet Take 1 tablet (180 mg total) by mouth daily for 15 days. Patient taking differently: Take 180 mg by mouth as needed. 02/27/21  03/14/21  Eliezer Lofts, FNP  hydrocortisone (ANUSOL-HC) 25 MG suppository Anusol suppositories, 1 per rectum at night for 1 week or as needed. 03/29/21   Irene Shipper, MD  hydrocortisone 2.5 % ointment Apply topically daily in the afternoon.    [provider]  pantoprazole (PROTONIX) 40 MG tablet Take 1 tablet (40 mg total) by mouth daily. 03/29/21   Irene Shipper, MD  rosuvastatin (CRESTOR) 20 MG tablet Take 20 mg by mouth daily.    [provider]    Family History Family History  Problem Relation Age of Onset   COPD Mother    Arrhythmia Mother    Colon cancer Father    Hypertension Father    Pulmonary fibrosis Father    Stroke Maternal Grandmother     Heart attack Maternal Grandfather    Ovarian cancer Paternal Grandmother    Breast cancer Neg Hx    Rectal cancer Neg Hx    Stomach cancer Neg Hx    Esophageal cancer Neg Hx     Social History Social History   Tobacco Use   Smoking status: Every Day    Packs/day: 0.50    Years: 32.00    Pack years: 16.00    Types: Cigarettes   Smokeless tobacco: Never  Vaping Use   Vaping Use: Never used  Substance Use Topics   Alcohol use: Yes    Comment: rarely, wine    Drug use: No     Allergies   Cymbalta [duloxetine hcl], Latex, and Morphine and related   Review of Systems Review of Systems See HPI  Physical Exam Triage Vital Signs ED Triage Vitals  Enc Vitals Group     BP 07/08/21 1256 100/64     Pulse Rate 07/08/21 1256 (!) 106     Resp 07/08/21 1256 18     Temp 07/08/21 1256 98 F (36.7 C)     Temp Source 07/08/21 1256 Oral     SpO2 07/08/21 1256 97 %     Weight 07/08/21 1258 190 lb (86.2 kg)     Height 07/08/21 1258 5\' 5"  (1.651 m)     Head Circumference --      Peak Flow --      Pain Score 07/08/21 1258 3     Pain Loc --      Pain Edu? --      Excl. in [place]? --    No data found.  Updated Vital Signs BP 100/64 (BP Location: Left Arm)   Pulse (!) 106   Temp 98 F (36.7 C) (Oral)   Resp 18   Ht 5\' 5"  (1.651 m)   Wt 86.2 kg   SpO2 97%   BMI 31.62 kg/m      Physical Exam Constitutional:      General: She is not in acute distress.    Appearance: Normal appearance. She is well-developed. She is not ill-appearing.  HENT:     Head: Normocephalic and atraumatic.     Right Ear: Tympanic membrane, ear canal and external ear normal.     Left Ear: Tympanic membrane, ear canal and external ear normal.     Nose: Nose normal. No congestion.     Mouth/Throat:     Mouth: Mucous membranes are moist.     Pharynx: Posterior oropharyngeal erythema present.  Eyes:     Conjunctiva/sclera: Conjunctivae normal.     Pupils: Pupils are equal, round, and reactive to  light.  Cardiovascular:  Rate and Rhythm: Normal rate and regular rhythm.     Heart sounds: Normal heart sounds.  Pulmonary:     Effort: Pulmonary effort is normal. No respiratory distress.     Breath sounds: Rales present. No wheezing or rhonchi.     Comments: Harsh cough.  Rales left base Abdominal:     General: There is no distension.     Palpations: Abdomen is soft.  Musculoskeletal:        General: Normal range of motion.     Cervical back: Normal range of motion.  Lymphadenopathy:     Cervical: Cervical adenopathy present.  Skin:    General: Skin is warm and dry.  Neurological:     Mental Status: She is alert.     UC Treatments / Results  Labs (all labs ordered are listed, but only abnormal results are displayed) Labs Reviewed  POCT URINALYSIS DIP (MANUAL ENTRY) - Abnormal; Notable for the following components:      Result Value   Clarity, UA cloudy (*)    Blood, UA moderate (*)    Leukocytes, UA Moderate (2+) (*)    All other components within normal limits  URINE CULTURE    EKG   Radiology DG Chest 2 View  Result Date: 07/08/2021 CLINICAL DATA:  Cough for the past 3 weeks. EXAM: CHEST - 2 VIEW COMPARISON:  Chest x-ray dated December 30, 2013. FINDINGS: The heart size and mediastinal contours are within normal limits. Both lungs are clear. The visualized skeletal structures are unremarkable. IMPRESSION: No active cardiopulmonary disease. Electronically Signed   By: Titus Dubin M.D.   On: 07/08/2021 14:07    Procedures Procedures (including critical care time)  Medications Ordered in UC Medications - No data to display  Initial Impression / Assessment and Plan / UC Course  I have reviewed the triage vital signs and the nursing notes.  Pertinent labs & imaging results that were available during my care of the patient were reviewed by me and considered in my medical decision making (see chart for details).     I will prescribe Bactrim for the urinary  tract infection.  This should help with an upper respiratory infection as well, if it is viral.  She may just have a persistent cough from viral bronchitis.  I am also prescribing Pyridium for her urinary discomfort.  Urine culture sent.  Return as needed Final Clinical Impressions(s) / UC Diagnoses   Final diagnoses:  Dysuria  Viral URI with cough  Lower urinary tract infectious disease     Discharge Instructions      Take the antibiotic 2 times a day for 7 days.  This will help both infections Take Mucinex DM or something similar for the cough and congestion Take Pyridium for urinary discomfort.  This will make your urine orange.  I recommend you wear a pad while on Pyridium so it does not stain your clothing Drink lots of water Follow-up with your primary care doctor     ED Prescriptions     Medication Sig Dispense Auth. Provider   phenazopyridine (PYRIDIUM) 200 MG tablet Take 1 tablet (200 mg total) by mouth 3 (three) times daily. 6 tablet Raylene Everts, MD   sulfamethoxazole-trimethoprim (BACTRIM DS) 800-160 MG tablet Take 1 tablet by mouth 2 (two) times daily for 7 days. 14 tablet Raylene Everts, MD      PDMP not reviewed this encounter.   Raylene Everts, MD 07/08/21 [phone]

## 2021-07-08 NOTE — ED Triage Notes (Signed)
Dysuria & urgency x 3 weeks  Mother passed this past week  Pt also noted a productive cough x 3 weeks - OTC ibuprofen & allegra (min relief) Negative home COVID test 2 weeks ago Covid vaccine + booster  Denies fever Denies flank pain  Denies hx of kidney stones

## 2021-07-08 NOTE — Discharge Instructions (Addendum)
Take the antibiotic 2 times a day for 7 days.  This will help both infections Take Mucinex DM or something similar for the cough and congestion Take Pyridium for urinary discomfort.  This will make your urine orange.  I recommend you wear a pad while on Pyridium so it does not stain your clothing Drink lots of water Follow-up with your primary care doctor

## 2021-07-10 LAB — URINE CULTURE
MICRO NUMBER:: 12450053
SPECIMEN QUALITY:: ADEQUATE

## 2021-09-05 ENCOUNTER — Other Ambulatory Visit: Payer: Self-pay

## 2021-09-05 ENCOUNTER — Emergency Department
Admission: RE | Admit: 2021-09-05 | Discharge: 2021-09-05 | Disposition: A | Discharge status: Home / Self Care | Payer: Federal, State, Local not specified - PPO | Source: Ambulatory Visit

## 2021-09-05 VITALS — BP 103/71 | HR 92 | Temp 98.7°F | Resp 18 | Ht 65.0 in | Wt 195.0 lb

## 2021-09-05 DIAGNOSIS — N3001 Acute cystitis with hematuria: Secondary | ICD-10-CM

## 2021-09-05 LAB — POCT URINALYSIS DIP (MANUAL ENTRY)
Bilirubin, UA: NEGATIVE
Glucose, UA: NEGATIVE mg/dL
Ketones, POC UA: NEGATIVE mg/dL
Nitrite, UA: NEGATIVE
Protein Ur, POC: NEGATIVE mg/dL
Spec Grav, UA: 1.025 (ref 1.010–1.025)
Urobilinogen, UA: 0.2 E.U./dL
pH, UA: 6 (ref 5.0–8.0)

## 2021-09-05 MED ORDER — NITROFURANTOIN MONOHYD MACRO 100 MG PO CAPS: 100.0000 mg | ORAL_CAPSULE | Freq: Two times a day (BID) | ORAL | 0 refills | Status: AC

## 2021-09-05 NOTE — ED Provider Notes (Signed)
[name]    CSN: [id number] Arrival date & time: 09/05/21  1755      History   Chief Complaint Chief Complaint  Patient presents with   Cough   Dysuria    HPI DAYANE HILLENBURG is a 60 y.o. female.   HPI 60 year old female presents with frequency and dysuria.  Past Medical History:  Diagnosis Date   Allergy    Anxiety    Cold 12/2013   cough   Colonic polyp    Constipation    Depression    Family history of anesthesia complication    mother N/V   Headache(784.0)    due to neck   Hyperlipidemia    IBS (irritable bowel syndrome)    Lumbar disc disease     Patient Active Problem List   Diagnosis Date Noted   Lateral epicondylitis, right elbow 02/15/2021   Cervical spondylosis without myelopathy 01/01/2014    Class: Diagnosis of   Cervical spondylosis 01/01/2014   URI 08/31/2008   SINUSITIS 06/02/2008   DEPRESSION 11/25/2007   GASTROESOPHAGEAL REFLUX DISEASE 11/25/2007   IRRITABLE BOWEL SYNDROME 11/25/2007   COLONIC POLYPS, HYPERPLASTIC 12/26/2001   HIATAL HERNIA 12/26/2001    Past Surgical History:  Procedure Laterality Date   ABDOMINAL HYSTERECTOMY     ANTERIOR CERVICAL DECOMP/DISCECTOMY FUSION N/A 01/01/2014   Procedure: ANTERIOR CERVICAL DECOMPRESSION/DISCECTOMY FUSION 2 LEVELS;  Surgeon: Marybelle Killings, MD;  Location: [place];  Service: Orthopedics;  Laterality: N/A;  C4-5, C5-6 Anterior Cervical Discectomy, Fusion, Allograft, Plate and Screws   BACK SURGERY     lumbr   BREAST SURGERY  2012   reduction   COLONOSCOPY  01/05/2021   09/18/18, 2011, 2003   POLYPECTOMY     REDUCTION MAMMAPLASTY Bilateral    TONSILLECTOMY      OB History   No obstetric history on file.      Home Medications    Prior to Admission medications   Medication Sig Start Date End Date Taking? Authorizing Provider  nitrofurantoin, macrocrystal-monohydrate, (MACROBID) 100 MG capsule Take 1 capsule (100 mg total) by mouth 2 (two) times daily for 7 days.  09/05/21 09/12/21 Yes Eliezer Lofts, FNP  ALPRAZolam Duanne Moron) 0.25 MG tablet Take 1-2 tabs (0.25mg -0.50mg ) 30-60 minutes before procedure. May repeat if needed.Do not drive. Patient taking differently: as needed. 12/01/20   Melvenia Beam, MD  fexofenadine [facility] ALLERGY) 180 MG tablet Take 1 tablet (180 mg total) by mouth daily for 15 days. Patient not taking: Reported on 09/05/2021 02/27/21 03/14/21  Eliezer Lofts, FNP  hydrocortisone (ANUSOL-HC) 25 MG suppository Anusol suppositories, 1 per rectum at night for 1 week or as needed. 03/29/21   Irene Shipper, MD  hydrocortisone 2.5 % ointment Apply topically daily in the afternoon. Patient not taking: Reported on 09/05/2021    [provider]  pantoprazole (PROTONIX) 40 MG tablet Take 1 tablet (40 mg total) by mouth daily. 03/29/21   Irene Shipper, MD  phenazopyridine (PYRIDIUM) 200 MG tablet Take 1 tablet (200 mg total) by mouth 3 (three) times daily. Patient not taking: Reported on 09/05/2021 07/08/21   Raylene Everts, MD  rosuvastatin (CRESTOR) 20 MG tablet Take 20 mg by mouth daily.    [provider]    Family History Family History  Problem Relation Age of Onset   COPD Mother    Arrhythmia Mother    Colon cancer Father    Hypertension Father    Pulmonary fibrosis Father    Stroke  Maternal Grandmother    Heart attack Maternal Grandfather    Ovarian cancer Paternal Grandmother    Breast cancer Neg Hx    Rectal cancer Neg Hx    Stomach cancer Neg Hx    Esophageal cancer Neg Hx     Social History Social History   Tobacco Use   Smoking status: Every Day    Packs/day: 0.50    Years: 32.00    Pack years: 16.00    Types: Cigarettes   Smokeless tobacco: Never  Vaping Use   Vaping Use: Never used  Substance Use Topics   Alcohol use: Yes    Comment: rarely, wine    Drug use: No     Allergies   Cymbalta [duloxetine hcl], Latex, and Morphine and related   Review of Systems Review of Systems   Genitourinary:  Positive for frequency.  All other systems reviewed and are negative.   Physical Exam Triage Vital Signs ED Triage Vitals  Enc Vitals Group     BP 09/05/21 1821 103/71     Pulse Rate 09/05/21 1821 92     Resp 09/05/21 1821 18     Temp 09/05/21 1821 98.7 F (37.1 C)     Temp Source 09/05/21 1821 Oral     SpO2 09/05/21 1821 95 %     Weight 09/05/21 1822 195 lb (88.5 kg)     Height 09/05/21 1822 5\' 5"  (1.651 m)     Head Circumference --      Peak Flow --      Pain Score 09/05/21 1822 4     Pain Loc --      Pain Edu? --      Excl. in [place]? --    No data found.  Updated Vital Signs BP 103/71 (BP Location: Right Arm)   Pulse 92   Temp 98.7 F (37.1 C) (Oral)   Resp 18   Ht 5\' 5"  (1.651 m)   Wt 195 lb (88.5 kg)   SpO2 95%   BMI 32.45 kg/m      Physical Exam Vitals and nursing note reviewed.  Constitutional:      Appearance: Normal appearance.  HENT:     Head: Normocephalic and atraumatic.     Right Ear: Tympanic membrane, ear canal and external ear normal.     Left Ear: Tympanic membrane, ear canal and external ear normal.     Mouth/Throat:     Mouth: Mucous membranes are moist.     Pharynx: Oropharynx is clear.  Eyes:     Extraocular Movements: Extraocular movements intact.     Conjunctiva/sclera: Conjunctivae normal.     Pupils: Pupils are equal, round, and reactive to light.  Cardiovascular:     Rate and Rhythm: Normal rate and regular rhythm.     Pulses: Normal pulses.     Heart sounds: Normal heart sounds.  Pulmonary:     Effort: Pulmonary effort is normal.     Breath sounds: Normal breath sounds.  Abdominal:     Tenderness: There is no right CVA tenderness or left CVA tenderness.  Musculoskeletal:        General: Normal range of motion.  Skin:    General: Skin is warm and dry.  Neurological:     General: No focal deficit present.     Mental Status: She is alert and oriented to person, place, and time.     UC Treatments / Results   Labs (all labs ordered are listed, but only  abnormal results are displayed) Labs Reviewed  POCT URINALYSIS DIP (MANUAL ENTRY) - Abnormal; Notable for the following components:      Result Value   Clarity, UA cloudy (*)    Blood, UA moderate (*)    Leukocytes, UA Trace (*)    All other components within normal limits  URINE CULTURE    EKG   Radiology No results found.  Procedures Procedures (including critical care time)  Medications Ordered in UC Medications - No data to display  Initial Impression / Assessment and Plan / UC Course  I have reviewed the triage vital signs and the nursing notes.  Pertinent labs & imaging results that were available during my care of the patient were reviewed by me and considered in my medical decision making (see chart for details).     MDM: 1.  Acute cystitis with hematuria-Rx'd Macrobid. Advised patient to take medication as directed with food to completion.  Encouraged patient to increase daily water intake while taking this medication.  Advised patient we will follow-up with urine culture results once received.  Patient discharged home, hemodynamically stable. Final Clinical Impressions(s) / UC Diagnoses   Final diagnoses:  Acute cystitis with hematuria     Discharge Instructions      Advised patient to take medication as directed with food to completion.  Encouraged patient to increase daily water intake while taking this medication.  Advised patient we will follow-up with urine culture results once received.     ED Prescriptions     Medication Sig Dispense Auth. Provider   nitrofurantoin, macrocrystal-monohydrate, (MACROBID) 100 MG capsule Take 1 capsule (100 mg total) by mouth 2 (two) times daily for 7 days. 14 capsule Eliezer Lofts, FNP      PDMP not reviewed this encounter.   Eliezer Lofts, [place] 09/05/21 1926

## 2021-09-05 NOTE — ED Triage Notes (Addendum)
Chronic cough  Not really better since then  OTC - robitussin AM/PM Ibuprofen for headache due to cough  No flu vaccine  Frequency & dysuria x 2 days

## 2021-09-05 NOTE — Discharge Instructions (Addendum)
Advised patient to take medication as directed with food to completion.  Encouraged patient to increase daily water intake while taking this medication.  Advised patient we will follow-up with urine culture results once received. °

## 2021-09-08 LAB — URINE CULTURE
MICRO NUMBER:: 12692749
SPECIMEN QUALITY:: ADEQUATE

## 2021-11-27 IMAGING — DX DG CHEST 2V
2 series · 2 of 2 positions shown · non-contrast
Comparison: Chest x-ray dated December 30, 2013.

CLINICAL DATA: Cough for the past 3 weeks.

EXAM:
CHEST - 2 VIEW

[chest pa]
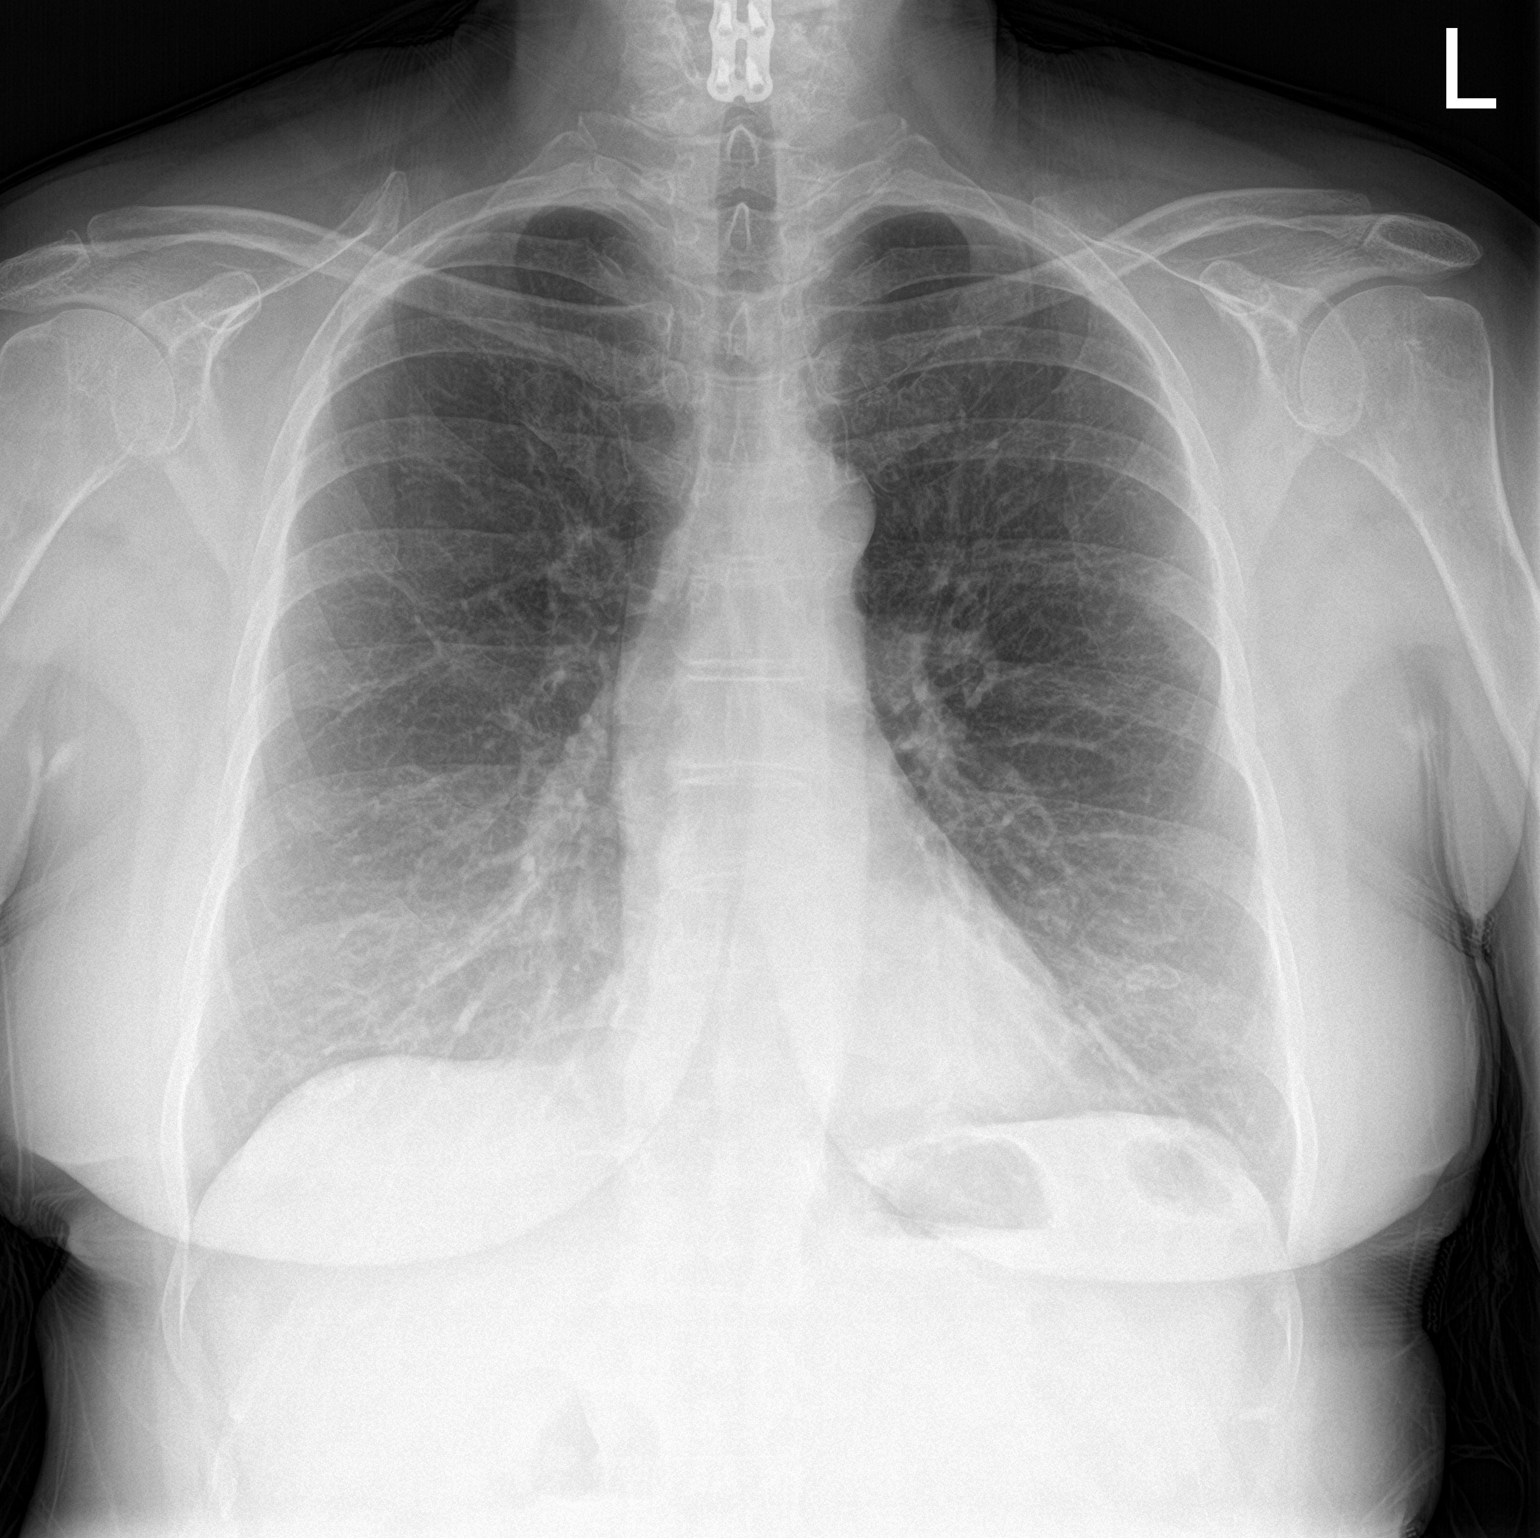

[chest lat]
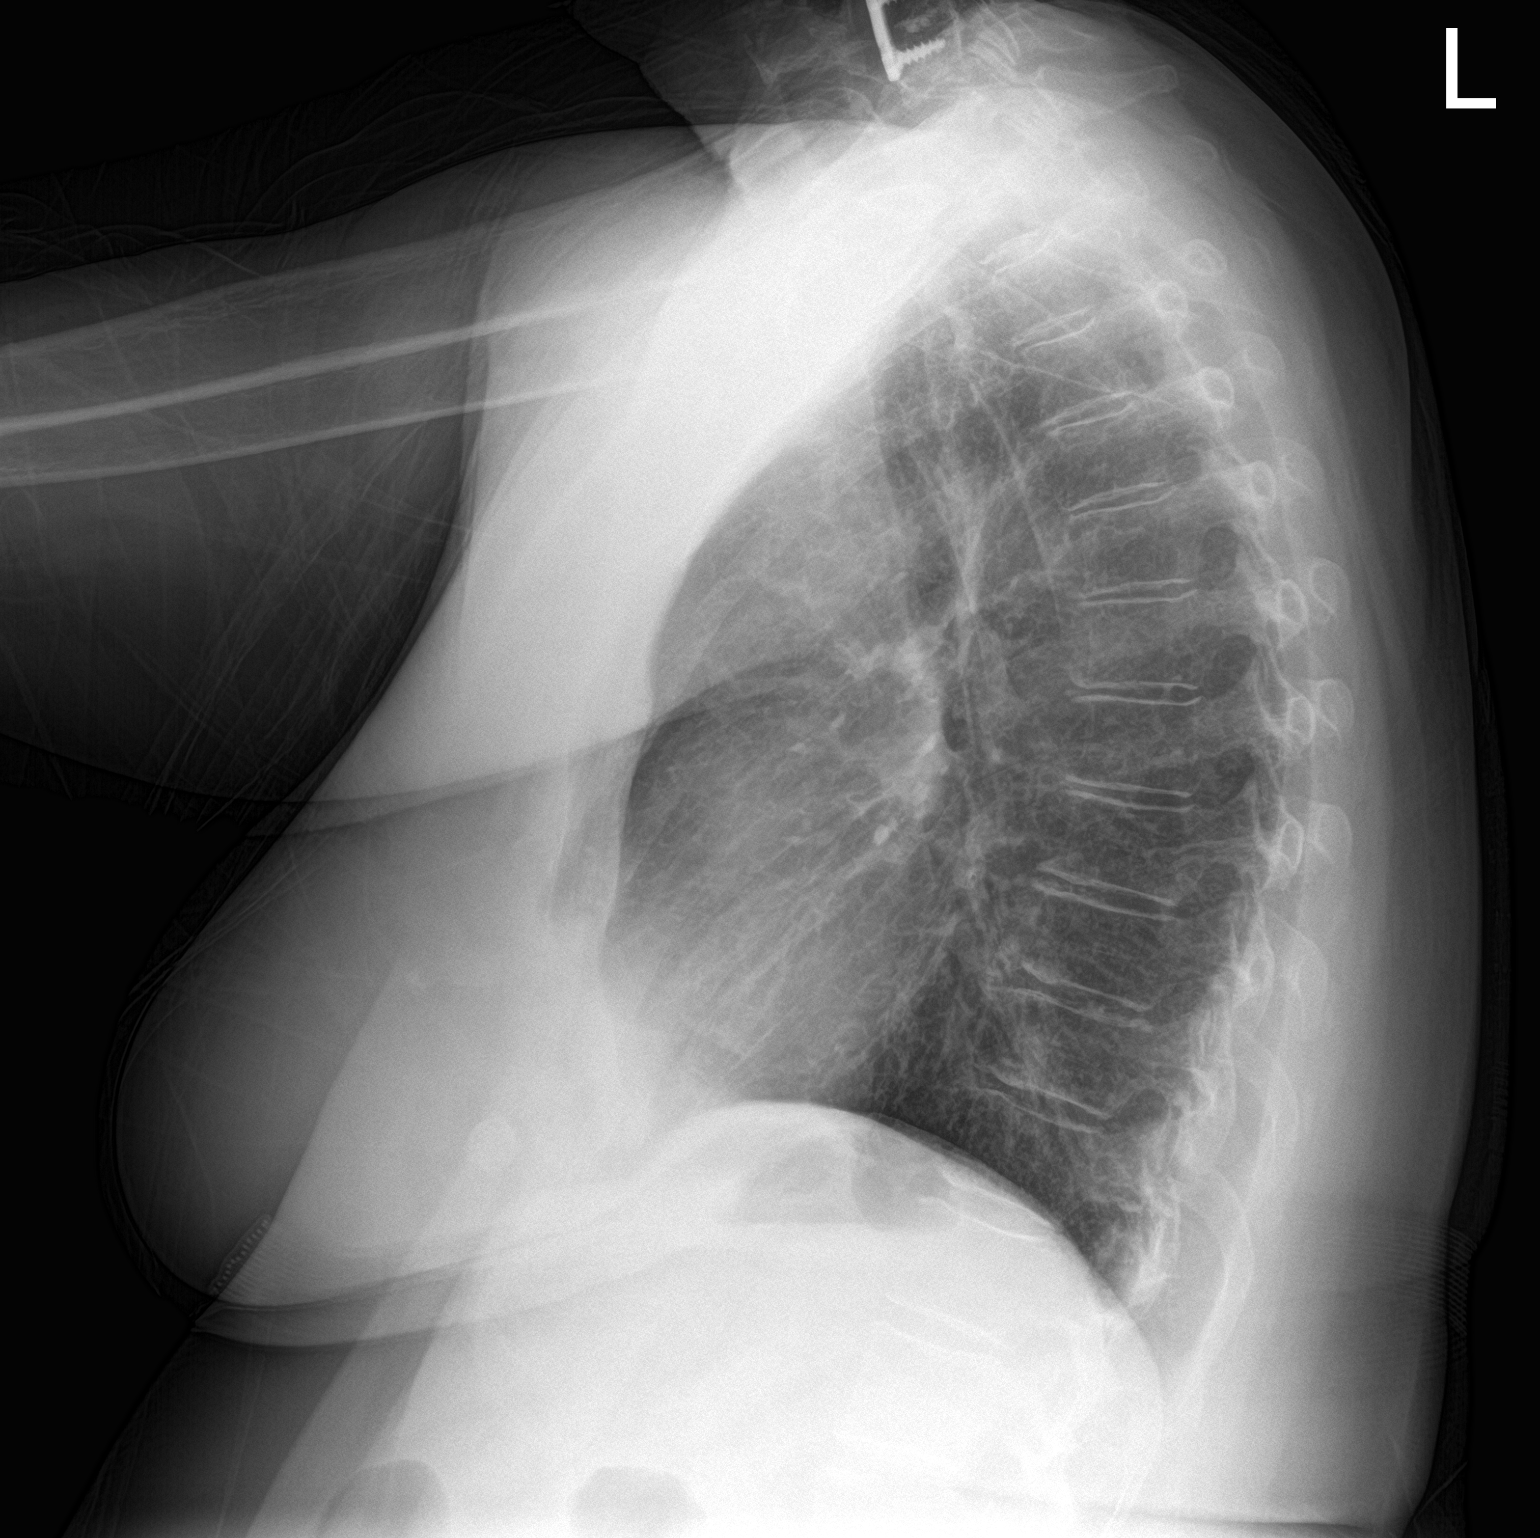

[2 of 2 positions shown; findings below may reference images not displayed]

FINDINGS: The heart size and mediastinal contours are within normal limits.
Both lungs are clear. The visualized skeletal structures are
unremarkable.
IMPRESSION: No active cardiopulmonary disease.

## 2022-08-28 ENCOUNTER — Ambulatory Visit
Admission: RE | Admit: 2022-08-28 | Discharge: 2022-08-28 | Disposition: A | Discharge status: Home / Self Care | Payer: Federal, State, Local not specified - PPO | Source: Ambulatory Visit

## 2022-08-28 VITALS — BP 102/69 | HR 78 | Temp 98.7°F | Resp 18 | Ht 65.0 in | Wt 195.0 lb

## 2022-08-28 DIAGNOSIS — N3001 Acute cystitis with hematuria: Secondary | ICD-10-CM | POA: Diagnosis not present

## 2022-08-28 LAB — POCT URINALYSIS DIP (MANUAL ENTRY)
Bilirubin, UA: NEGATIVE
Glucose, UA: NEGATIVE mg/dL
Ketones, POC UA: NEGATIVE mg/dL
Nitrite, UA: NEGATIVE
Protein Ur, POC: NEGATIVE mg/dL
Spec Grav, UA: <=1.005 — AB (ref 1.010–1.025)
Urobilinogen, UA: 0.2 E.U./dL
pH, UA: 6.5 (ref 5.0–8.0)

## 2022-08-28 MED ORDER — SULFAMETHOXAZOLE-TRIMETHOPRIM 800-160 MG PO TABS: 1.0000 | ORAL_TABLET | Freq: Two times a day (BID) | ORAL | 0 refills | Status: AC

## 2022-08-28 NOTE — Discharge Instructions (Signed)
Start sulfamethoxazole-trimethoprim twice daily for 3 days.  Make sure you rest and drink plenty of fluid.  We will contact you if we need to change her antibiotics based on susceptibilities identified in culture.  Follow-up with your primary care within a few weeks for repeat urine to ensure that the blood we noted today clears once we treat the infection.  If anything changes or worsens please return for reevaluation including abdominal pain, blood in your urine, pelvic pain, fever, nausea, vomiting.

## 2022-08-28 NOTE — ED Provider Notes (Signed)
[name]    CSN: [id number] Arrival date & time: 08/28/22  1000      History   Chief Complaint Chief Complaint  Patient presents with   Urinary Frequency    Potential UTI - Entered by patient    HPI Stephanie Deleon is a 61 y.o. female.   Patient presents today with a 1 week history of UTI symptoms including dysuria, urinary frequency, urinary urgency.  She denies any hematuria, flank pain, abdominal pain, fever, nausea, vomiting.  She does have a history of UTI and states symptoms are similar to previous episodes of this condition.  Denies any recent antibiotics in the past 90 days.  Denies any recent urogenital procedure, self-catheterization, history of nephrolithiasis, seeing a urologist in the past.  She denies history of diabetes and does not take an SGLT2 inhibitor.    Past Medical History:  Diagnosis Date   Allergy    Anxiety    Cold 12/2013   cough   Colonic polyp    Constipation    Depression    Family history of anesthesia complication    mother N/V   Headache(784.0)    due to neck   Hyperlipidemia    IBS (irritable bowel syndrome)    Lumbar disc disease     Patient Active Problem List   Diagnosis Date Noted   Lateral epicondylitis, right elbow 02/15/2021   Cervical spondylosis without myelopathy 01/01/2014    Class: Diagnosis of   Cervical spondylosis 01/01/2014   URI 08/31/2008   SINUSITIS 06/02/2008   DEPRESSION 11/25/2007   GASTROESOPHAGEAL REFLUX DISEASE 11/25/2007   IRRITABLE BOWEL SYNDROME 11/25/2007   COLONIC POLYPS, HYPERPLASTIC 12/26/2001   HIATAL HERNIA 12/26/2001    Past Surgical History:  Procedure Laterality Date   ABDOMINAL HYSTERECTOMY     ANTERIOR CERVICAL DECOMP/DISCECTOMY FUSION N/A 01/01/2014   Procedure: ANTERIOR CERVICAL DECOMPRESSION/DISCECTOMY FUSION 2 LEVELS;  Surgeon: Marybelle Killings, MD;  Location: [place];  Service: Orthopedics;  Laterality: N/A;  C4-5, C5-6 Anterior Cervical Discectomy, Fusion, Allograft,  Plate and Screws   BACK SURGERY     lumbr   BREAST SURGERY  2012   reduction   COLONOSCOPY  01/05/2021   09/18/18, 2011, 2003   POLYPECTOMY     REDUCTION MAMMAPLASTY Bilateral    TONSILLECTOMY      OB History   No obstetric history on file.      Home Medications    Prior to Admission medications   Medication Sig Start Date End Date Taking? Authorizing Provider  ALPRAZolam (XANAX) 0.25 MG tablet Take 1-2 tabs (0.'25mg'$ -0.'50mg'$ ) 30-60 minutes before procedure. May repeat if needed.Do not drive. Patient taking differently: as needed. 12/01/20  Yes Melvenia Beam, MD  hydrocortisone (ANUSOL-HC) 25 MG suppository Anusol suppositories, 1 per rectum at night for 1 week or as needed. 03/29/21  Yes Irene Shipper, MD  montelukast (SINGULAIR) 10 MG tablet Take 10 mg by mouth daily. 07/10/22  Yes [provider]  pantoprazole (PROTONIX) 40 MG tablet Take 1 tablet (40 mg total) by mouth daily. 03/29/21  Yes Irene Shipper, MD  rosuvastatin (CRESTOR) 20 MG tablet Take 20 mg by mouth daily.   Yes [provider]  sulfamethoxazole-trimethoprim (BACTRIM DS) 800-160 MG tablet Take 1 tablet by mouth 2 (two) times daily for 3 days. 08/28/22 08/31/22 Yes Shaunie Boehm K, PA-C  VITAMIN D, CHOLECALCIFEROL, PO Take by mouth.   Yes [provider]  fexofenadine (ALLEGRA ALLERGY) 180 MG tablet Take 1 tablet (  180 mg total) by mouth daily for 15 days. Patient not taking: Reported on 09/05/2021 02/27/21 03/14/21  Eliezer Lofts, FNP  hydrocortisone 2.5 % ointment Apply topically daily in the afternoon. Patient not taking: Reported on 09/05/2021    [provider]  phenazopyridine (PYRIDIUM) 200 MG tablet Take 1 tablet (200 mg total) by mouth 3 (three) times daily. Patient not taking: Reported on 09/05/2021 07/08/21   Raylene Everts, MD    Family History Family History  Problem Relation Age of Onset   COPD Mother    Arrhythmia Mother    Colon cancer Father    Hypertension  Father    Pulmonary fibrosis Father    Stroke Maternal Grandmother    Heart attack Maternal Grandfather    Ovarian cancer Paternal Grandmother    Breast cancer Neg Hx    Rectal cancer Neg Hx    Stomach cancer Neg Hx    Esophageal cancer Neg Hx     Social History Social History   Tobacco Use   Smoking status: Every Day    Packs/day: 0.50    Years: 32.00    Total pack years: 16.00    Types: Cigarettes   Smokeless tobacco: Never  Vaping Use   Vaping Use: Never used  Substance Use Topics   Alcohol use: Yes    Comment: rarely, wine    Drug use: No     Allergies   Cymbalta [duloxetine hcl], Latex, and Morphine and related   Review of Systems Review of Systems  Constitutional:  Positive for activity change. Negative for appetite change, fatigue and fever.  Gastrointestinal:  Negative for abdominal pain, diarrhea, nausea and vomiting.  Genitourinary:  Positive for dysuria, frequency and urgency. Negative for flank pain, hematuria, vaginal bleeding, vaginal discharge and vaginal pain.     Physical Exam Triage Vital Signs ED Triage Vitals  Enc Vitals Group     BP 08/28/22 1004 102/69     Pulse Rate 08/28/22 1004 78     Resp 08/28/22 1004 18     Temp 08/28/22 1004 98.7 F (37.1 C)     Temp Source 08/28/22 1004 Oral     SpO2 08/28/22 1004 95 %     Weight 08/28/22 1005 195 lb (88.5 kg)     Height 08/28/22 1005 '5\' 5"'$  (1.651 m)     Head Circumference --      Peak Flow --      Pain Score 08/28/22 1005 4     Pain Loc --      Pain Edu? --      Excl. in [place]? --    No data found.  Updated Vital Signs BP 102/69 (BP Location: Right Arm)   Pulse 78   Temp 98.7 F (37.1 C) (Oral)   Resp 18   Ht '5\' 5"'$  (1.651 m)   Wt 195 lb (88.5 kg)   SpO2 95%   BMI 32.45 kg/m   Visual Acuity Right Eye Distance:   Left Eye Distance:   Bilateral Distance:    Right Eye Near:   Left Eye Near:    Bilateral Near:     Physical Exam Vitals reviewed.  Constitutional:       General: She is awake. She is not in acute distress.    Appearance: Normal appearance. She is well-developed. She is not ill-appearing.     Comments: Very pleasant female appears stated age in no acute distress sitting comfortably in exam room  HENT:  Head: Normocephalic and atraumatic.  Cardiovascular:     Rate and Rhythm: Normal rate and regular rhythm.     Heart sounds: Normal heart sounds, S1 normal and S2 normal. No murmur heard. Pulmonary:     Effort: Pulmonary effort is normal.     Breath sounds: Normal breath sounds. No wheezing, rhonchi or rales.     Comments: Clear to auscultation bilaterally Abdominal:     General: Bowel sounds are normal.     Palpations: Abdomen is soft.     Tenderness: There is no abdominal tenderness. There is no right CVA tenderness, left CVA tenderness, guarding or rebound.     Comments: Benign abdominal exam  Psychiatric:        Behavior: Behavior is cooperative.      UC Treatments / Results  Labs (all labs ordered are listed, but only abnormal results are displayed) Labs Reviewed  POCT URINALYSIS DIP (MANUAL ENTRY) - Abnormal; Notable for the following components:      Result Value   Spec Grav, UA <=1.005 (*)    Blood, UA moderate (*)    Leukocytes, UA Moderate (2+) (*)    All other components within normal limits  URINE CULTURE    EKG   Radiology No results found.  Procedures Procedures (including critical care time)  Medications Ordered in UC Medications - No data to display  Initial Impression / Assessment and Plan / UC Course  I have reviewed the triage vital signs and the nursing notes.  Pertinent labs & imaging results that were available during my care of the patient were reviewed by me and considered in my medical decision making (see chart for details).     UA showed evidence of infection.  Patient was empirically treated with Bactrim DS twice daily for 3 days.  Urine culture was obtained and we discussed potential  need to change antibiotics based on susceptibilities identified on culture.  She is to rest and drink plenty of fluids.  Discussed that if she has any worsening symptoms including abdominal pain, pelvic pain, fever, nausea, vomiting she needs to be seen immediately.  Strict return precautions given.  Did discuss that she will need to follow-up with her PCP in approximately 2 to 4 weeks to ensure that blood noted today resolved with clearing of infection.  Final Clinical Impressions(s) / UC Diagnoses   Final diagnoses:  Acute cystitis with hematuria     Discharge Instructions      Start sulfamethoxazole-trimethoprim twice daily for 3 days.  Make sure you rest and drink plenty of fluid.  We will contact you if we need to change her antibiotics based on susceptibilities identified in culture.  Follow-up with your primary care within a few weeks for repeat urine to ensure that the blood we noted today clears once we treat the infection.  If anything changes or worsens please return for reevaluation including abdominal pain, blood in your urine, pelvic pain, fever, nausea, vomiting.     ED Prescriptions     Medication Sig Dispense Auth. Provider   sulfamethoxazole-trimethoprim (BACTRIM DS) 800-160 MG tablet Take 1 tablet by mouth 2 (two) times daily for 3 days. 6 tablet Zeena Starkel, [place] Skill, PA-C      PDMP not reviewed this encounter.   Terrilee Croak, PA-C 08/28/22 1047

## 2022-08-28 NOTE — ED Triage Notes (Signed)
Patient c/o possible UTI, urinary urgency and dysuria x 2 weeks.  Patient denies any hematuria.  Afebrile.

## 2022-08-30 LAB — URINE CULTURE: Culture: 20000 — AB

## 2022-10-15 ENCOUNTER — Ambulatory Visit (INDEPENDENT_AMBULATORY_CARE_PROVIDER_SITE_OTHER): Payer: Federal, State, Local not specified - PPO

## 2022-10-15 ENCOUNTER — Ambulatory Visit
Admission: RE | Admit: 2022-10-15 | Discharge: 2022-10-15 | Disposition: A | Discharge status: Home / Self Care | Payer: Federal, State, Local not specified - PPO | Source: Ambulatory Visit | Attending: Family Medicine | Admitting: Family Medicine

## 2022-10-15 VITALS — BP 135/84 | HR 71 | Temp 98.0°F | Resp 18 | Wt 195.0 lb

## 2022-10-15 DIAGNOSIS — R058 Other specified cough: Secondary | ICD-10-CM | POA: Diagnosis not present

## 2022-10-15 DIAGNOSIS — R059 Cough, unspecified: Secondary | ICD-10-CM | POA: Diagnosis not present

## 2022-10-15 DIAGNOSIS — J40 Bronchitis, not specified as acute or chronic: Secondary | ICD-10-CM

## 2022-10-15 MED ORDER — DOXYCYCLINE HYCLATE 100 MG PO CAPS: 100.0000 mg | ORAL_CAPSULE | Freq: Two times a day (BID) | ORAL | 0 refills | Status: AC

## 2022-10-15 MED ORDER — PROMETHAZINE-DM 6.25-15 MG/5ML PO SYRP: 5.0000 mL | ORAL_SOLUTION | Freq: Two times a day (BID) | ORAL | 0 refills | Status: DC | PRN

## 2022-10-15 MED ORDER — PREDNISONE 10 MG (21) PO TBPK: ORAL_TABLET | Freq: Every day | ORAL | 0 refills | Status: DC

## 2022-10-15 NOTE — ED Provider Notes (Signed)
[facility]    CSN: [id number] Arrival date & time: 10/15/22  0841      History   Chief Complaint Chief Complaint  Patient presents with   Cough   Nasal Congestion    HPI Stephanie Deleon is a 62 y.o. female.   HPI 19-year-old female presents with lingering cough and congestion for 1 week she thinks she had the flu for a week.  Reports history of bronchitis.Marland Kitchen  PMH significant for obesity, IBS, and GERD.  Past Medical History:  Diagnosis Date   Allergy    Anxiety    Cold 12/2013   cough   Colonic polyp    Constipation    Depression    Family history of anesthesia complication    mother N/V   Headache(784.0)    due to neck   Hyperlipidemia    IBS (irritable bowel syndrome)    Lumbar disc disease     Patient Active Problem List   Diagnosis Date Noted   Lateral epicondylitis, right elbow 02/15/2021   Cervical spondylosis without myelopathy 01/01/2014    Class: Diagnosis of   Cervical spondylosis 01/01/2014   URI 08/31/2008   SINUSITIS 06/02/2008   DEPRESSION 11/25/2007   GASTROESOPHAGEAL REFLUX DISEASE 11/25/2007   IRRITABLE BOWEL SYNDROME 11/25/2007   COLONIC POLYPS, HYPERPLASTIC 12/26/2001   HIATAL HERNIA 12/26/2001    Past Surgical History:  Procedure Laterality Date   ABDOMINAL HYSTERECTOMY     ANTERIOR CERVICAL DECOMP/DISCECTOMY FUSION N/A 01/01/2014   Procedure: ANTERIOR CERVICAL DECOMPRESSION/DISCECTOMY FUSION 2 LEVELS;  Surgeon: Stephanie Killings, MD;  Location: [facility];  Service: Orthopedics;  Laterality: N/A;  C4-5, C5-6 Anterior Cervical Discectomy, Fusion, Allograft, Plate and Screws   BACK SURGERY     lumbr   BREAST SURGERY  2012   reduction   COLONOSCOPY  01/05/2021   09/18/18, 2011, 2003   POLYPECTOMY     REDUCTION MAMMAPLASTY Bilateral    TONSILLECTOMY      OB History   No obstetric history on file.      Home Medications    Prior to Admission medications   Medication Sig Start Date End Date Taking? Authorizing Provider   doxycycline (VIBRAMYCIN) 100 MG capsule Take 1 capsule (100 mg total) by mouth 2 (two) times daily for 7 days. 10/15/22 10/22/22 Yes Stephanie Lofts, FNP  predniSONE (STERAPRED UNI-PAK 21 TAB) 10 MG (21) TBPK tablet Take by mouth daily. Take 6 tabs by mouth daily  for 2 days, then 5 tabs for 2 days, then 4 tabs for 2 days, then 3 tabs for 2 days, 2 tabs for 2 days, then 1 tab by mouth daily for 2 days 10/15/22  Yes Stephanie Lofts, FNP  promethazine-dextromethorphan (PROMETHAZINE-DM) 6.25-15 MG/5ML syrup Take 5 mLs by mouth 2 (two) times daily as needed for cough. 10/15/22  Yes Stephanie Lofts, FNP  hydrocortisone (ANUSOL-HC) 25 MG suppository Anusol suppositories, 1 per rectum at night for 1 week or as needed. 03/29/21   Stephanie Shipper, MD  montelukast (SINGULAIR) 10 MG tablet Take 10 mg by mouth daily. 07/10/22   [provider]  pantoprazole (PROTONIX) 40 MG tablet Take 1 tablet (40 mg total) by mouth daily. 03/29/21   Stephanie Shipper, MD  rosuvastatin (CRESTOR) 20 MG tablet Take 20 mg by mouth daily.    [provider]  VITAMIN D, CHOLECALCIFEROL, PO Take by mouth.    [provider]    Family History Family History  Problem Relation Age of Onset  COPD Mother    Arrhythmia Mother    Colon cancer Father    Hypertension Father    Pulmonary fibrosis Father    Stroke Maternal Grandmother    Heart attack Maternal Grandfather    Ovarian cancer Paternal Grandmother    Breast cancer Neg Hx    Rectal cancer Neg Hx    Stomach cancer Neg Hx    Esophageal cancer Neg Hx     Social History Social History   Tobacco Use   Smoking status: Every Day    Packs/day: 0.50    Years: 32.00    Total pack years: 16.00    Types: Cigarettes   Smokeless tobacco: Never  Vaping Use   Vaping Use: Never used  Substance Use Topics   Alcohol use: Yes    Comment: rarely, wine    Drug use: No     Allergies   Cymbalta [duloxetine hcl], Latex, and Morphine and related   Review of  Systems Review of Systems  HENT:  Positive for congestion.   Respiratory:  Positive for cough.   All other systems reviewed and are negative.    Physical Exam Triage Vital Signs ED Triage Vitals  Enc Vitals Group     BP 10/15/22 0905 135/84     Pulse Rate 10/15/22 0905 71     Resp 10/15/22 0905 18     Temp 10/15/22 0905 98 F (36.7 C)     Temp Source 10/15/22 0905 Oral     SpO2 10/15/22 0905 91 %     Weight 10/15/22 0907 195 lb (88.5 kg)     Height --      Head Circumference --      Peak Flow --      Pain Score 10/15/22 0906 0     Pain Loc --      Pain Edu? --      Excl. in [place]? --    No data found.  Updated Vital Signs BP 135/84 (BP Location: Right Arm)   Pulse 71   Temp 98 F (36.7 C) (Oral)   Resp 18   Wt 195 lb (88.5 kg)   SpO2 91%   BMI 32.45 kg/m   Visual Acuity Right Eye Distance:   Left Eye Distance:   Bilateral Distance:    Right Eye Near:   Left Eye Near:    Bilateral Near:     Physical Exam Vitals and nursing note reviewed.  Constitutional:      General: She is not in acute distress.    Appearance: Normal appearance. She is obese. She is ill-appearing.  HENT:     Head: Atraumatic.     Right Ear: Tympanic membrane, ear canal and external ear normal.     Left Ear: Tympanic membrane, ear canal and external ear normal.     Mouth/Throat:     Mouth: Mucous membranes are moist.     Pharynx: Oropharynx is clear.  Eyes:     Extraocular Movements: Extraocular movements intact.     Conjunctiva/sclera: Conjunctivae normal.     Pupils: Pupils are equal, round, and reactive to light.  Cardiovascular:     Pulses: Normal pulses.     Heart sounds: Normal heart sounds. No murmur heard. Pulmonary:     Effort: Pulmonary effort is normal.     Breath sounds: Wheezing and rhonchi present. No rales.     Comments: Diminished breath sounds throughout with rhonchi and audible wheezes over right middle and lower noted Musculoskeletal:  General: Normal  range of motion.     Cervical back: Normal range of motion and neck supple.  Skin:    General: Skin is warm and dry.  Neurological:     General: No focal deficit present.     Mental Status: She is alert and oriented to person, place, and time. Mental status is at baseline.      UC Treatments / Results  Labs (all labs ordered are listed, but only abnormal results are displayed) Labs Reviewed - No data to display  EKG   Radiology DG Chest 2 View  Result Date: 10/15/2022 CLINICAL DATA:  Productive cough for 1 week EXAM: CHEST - 2 VIEW COMPARISON:  07/08/2021 FINDINGS: The heart size and mediastinal contours are within normal limits. Both lungs are clear. The visualized skeletal structures are unremarkable. IMPRESSION: No active cardiopulmonary disease. Electronically Signed   By: Kathreen Devoid M.D.   On: 10/15/2022 09:51    Procedures Procedures (including critical care time)  Medications Ordered in UC Medications - No data to display  Initial Impression / Assessment and Plan / UC Course  I have reviewed the triage vital signs and the nursing notes.  Pertinent labs & imaging results that were available during my care of the patient were reviewed by me and considered in my medical decision making (see chart for details).     MDM: 1.  Cough-Rx'd doxycycline, Promethazine DM, patient with history of bronchitis/cough/URI; 2.  Acute bronchitis-Rx'd Sterapred Unipak. Advised patient to take medication as directed with food to completion.  Advised patient to take prednisone with first dose of doxycycline for the next 10 days.  Advised may use promethazine for cough.  Patient cautioned on the sedative effects of this medication.  Encouraged increase daily water intake to 64 ounces per day while taking these medications.  Advised if symptoms worsen and/or unresolved please follow-up with PCP or here for further evaluation. Final Clinical Impressions(s) / UC Diagnoses   Final diagnoses:   Bronchitis  Cough, unspecified type     Discharge Instructions      Advised patient to take medication as directed with food to completion.  Advised patient to take prednisone with first dose of doxycycline for the next 10 days.  Advised may use promethazine for cough.  Patient cautioned on the sedative effects of this medication.  Encouraged increase daily water intake to 64 ounces per day while taking these medications.  Advised if symptoms worsen and/or unresolved please follow-up with PCP or here for further evaluation.     ED Prescriptions     Medication Sig Dispense Auth. Provider   predniSONE (STERAPRED UNI-PAK 21 TAB) 10 MG (21) TBPK tablet Take by mouth daily. Take 6 tabs by mouth daily  for 2 days, then 5 tabs for 2 days, then 4 tabs for 2 days, then 3 tabs for 2 days, 2 tabs for 2 days, then 1 tab by mouth daily for 2 days 42 tablet Stephanie Lofts, FNP   doxycycline (VIBRAMYCIN) 100 MG capsule Take 1 capsule (100 mg total) by mouth 2 (two) times daily for 7 days. 14 capsule Stephanie Lofts, FNP   promethazine-dextromethorphan (PROMETHAZINE-DM) 6.25-15 MG/5ML syrup Take 5 mLs by mouth 2 (two) times daily as needed for cough. 118 mL Stephanie Lofts, FNP      PDMP not reviewed this encounter.   Stephanie Deleon, [place] 10/15/22 1030

## 2022-10-15 NOTE — ED Triage Notes (Signed)
Pt c/o lingering cough and congestion x 1 week. Thinks she had the flu week. Shortness of breath. Hx of bronchitis. Taking robitussin and ibuprofen prn.

## 2022-10-15 NOTE — Discharge Instructions (Addendum)
Advised patient to take medication as directed with food to completion.  Advised patient to take prednisone with first dose of doxycycline for the next 10 days.  Advised may use promethazine for cough.  Patient cautioned on the sedative effects of this medication.  Encouraged increase daily water intake to 64 ounces per day while taking these medications.  Advised if symptoms worsen and/or unresolved please follow-up with PCP or here for further evaluation.

## 2022-10-31 DIAGNOSIS — K5900 Constipation, unspecified: Secondary | ICD-10-CM | POA: Diagnosis not present

## 2022-10-31 DIAGNOSIS — K625 Hemorrhage of anus and rectum: Secondary | ICD-10-CM | POA: Diagnosis not present

## 2022-10-31 DIAGNOSIS — R1010 Upper abdominal pain, unspecified: Secondary | ICD-10-CM | POA: Diagnosis not present

## 2022-10-31 DIAGNOSIS — K219 Gastro-esophageal reflux disease without esophagitis: Secondary | ICD-10-CM | POA: Diagnosis not present

## 2022-11-09 DIAGNOSIS — K219 Gastro-esophageal reflux disease without esophagitis: Secondary | ICD-10-CM | POA: Diagnosis not present

## 2022-11-09 DIAGNOSIS — Z23 Encounter for immunization: Secondary | ICD-10-CM | POA: Diagnosis not present

## 2022-11-09 DIAGNOSIS — Z0000 Encounter for general adult medical examination without abnormal findings: Secondary | ICD-10-CM | POA: Diagnosis not present

## 2022-11-09 DIAGNOSIS — E7800 Pure hypercholesterolemia, unspecified: Secondary | ICD-10-CM | POA: Diagnosis not present

## 2022-11-09 DIAGNOSIS — F419 Anxiety disorder, unspecified: Secondary | ICD-10-CM | POA: Diagnosis not present

## 2022-11-09 DIAGNOSIS — J301 Allergic rhinitis due to pollen: Secondary | ICD-10-CM | POA: Diagnosis not present

## 2023-06-14 ENCOUNTER — Ambulatory Visit
Admission: RE | Admit: 2023-06-14 | Discharge: 2023-06-14 | Disposition: A | Discharge status: Home / Self Care | Payer: Federal, State, Local not specified - PPO | Source: Ambulatory Visit | Attending: Family Medicine | Admitting: Family Medicine

## 2023-06-14 ENCOUNTER — Other Ambulatory Visit: Payer: Self-pay

## 2023-06-14 VITALS — BP 106/74 | HR 73 | Temp 98.0°F | Resp 16

## 2023-06-14 DIAGNOSIS — N3001 Acute cystitis with hematuria: Secondary | ICD-10-CM | POA: Diagnosis not present

## 2023-06-14 DIAGNOSIS — R300 Dysuria: Secondary | ICD-10-CM | POA: Diagnosis not present

## 2023-06-14 LAB — POCT URINALYSIS DIP (MANUAL ENTRY)
Bilirubin, UA: NEGATIVE
Glucose, UA: NEGATIVE mg/dL
Ketones, POC UA: NEGATIVE mg/dL
Nitrite, UA: NEGATIVE
Protein Ur, POC: NEGATIVE mg/dL
Spec Grav, UA: 1.025 (ref 1.010–1.025)
Urobilinogen, UA: 0.2 U/dL
pH, UA: 6 (ref 5.0–8.0)

## 2023-06-14 MED ORDER — SULFAMETHOXAZOLE-TRIMETHOPRIM 800-160 MG PO TABS: 1.0000 | ORAL_TABLET | Freq: Two times a day (BID) | ORAL | 0 refills | Status: AC

## 2023-06-14 NOTE — Discharge Instructions (Addendum)
Advised patient to take medication as directed with food to completion.  Encouraged increase daily water intake to 64 ounces per day while taking this medication.  Advised we will follow-up with urine culture results once received.  Advised if symptoms worsen and/or unresolved please follow-up with PCP, or here for further evaluation.

## 2023-06-14 NOTE — ED Triage Notes (Signed)
Pt reports urinary frequency, urgency and pain x 4 days

## 2023-06-14 NOTE — ED Provider Notes (Signed)
[facility]    CSN: [id number] Arrival date & time: 06/14/23  1016      History   Chief Complaint Chief Complaint  Patient presents with   Urinary Frequency    Potential UTI - Entered by patient   Dysuria    HPI Stephanie Deleon is a 62 y.o. female.   HPI Pleasant 62 year old female presents with dysuria and urine frequency.  PMH significant for IBS, depression, and lumbar disc disease.  Past Medical History:  Diagnosis Date   Allergy    Anxiety    Cold 12/2013   cough   Colonic polyp    Constipation    Depression    Family history of anesthesia complication    mother N/V   Headache(784.0)    due to neck   Hyperlipidemia    IBS (irritable bowel syndrome)    Lumbar disc disease     Patient Active Problem List   Diagnosis Date Noted   Lateral epicondylitis, right elbow 02/15/2021   Cervical spondylosis without myelopathy 01/01/2014    Class: Diagnosis of   Cervical spondylosis 01/01/2014   URI 08/31/2008   SINUSITIS 06/02/2008   DEPRESSION 11/25/2007   GASTROESOPHAGEAL REFLUX DISEASE 11/25/2007   IRRITABLE BOWEL SYNDROME 11/25/2007   COLONIC POLYPS, HYPERPLASTIC 12/26/2001   HIATAL HERNIA 12/26/2001    Past Surgical History:  Procedure Laterality Date   ABDOMINAL HYSTERECTOMY     ANTERIOR CERVICAL DECOMP/DISCECTOMY FUSION N/A 01/01/2014   Procedure: ANTERIOR CERVICAL DECOMPRESSION/DISCECTOMY FUSION 2 LEVELS;  Surgeon: Eldred Manges, MD;  Location: [facility];  Service: Orthopedics;  Laterality: N/A;  C4-5, C5-6 Anterior Cervical Discectomy, Fusion, Allograft, Plate and Screws   BACK SURGERY     lumbr   BREAST SURGERY  2012   reduction   COLONOSCOPY  01/05/2021   09/18/18, 2011, 2003   POLYPECTOMY     REDUCTION MAMMAPLASTY Bilateral    TONSILLECTOMY      OB History   No obstetric history on file.      Home Medications    Prior to Admission medications   Medication Sig Start Date End Date Taking? Authorizing Provider   sulfamethoxazole-trimethoprim (BACTRIM DS) 800-160 MG tablet Take 1 tablet by mouth 2 (two) times daily for 7 days. 06/14/23 06/21/23 Yes Trevor Iha, FNP  hydrocortisone (ANUSOL-HC) 25 MG suppository Anusol suppositories, 1 per rectum at night for 1 week or as needed. 03/29/21   Hilarie Fredrickson, MD  montelukast (SINGULAIR) 10 MG tablet Take 10 mg by mouth daily. 07/10/22   [provider]  pantoprazole (PROTONIX) 40 MG tablet Take 1 tablet (40 mg total) by mouth daily. 03/29/21   Hilarie Fredrickson, MD  predniSONE (STERAPRED UNI-PAK 21 TAB) 10 MG (21) TBPK tablet Take by mouth daily. Take 6 tabs by mouth daily  for 2 days, then 5 tabs for 2 days, then 4 tabs for 2 days, then 3 tabs for 2 days, 2 tabs for 2 days, then 1 tab by mouth daily for 2 days 10/15/22   Trevor Iha, FNP  promethazine-dextromethorphan (PROMETHAZINE-DM) 6.25-15 MG/5ML syrup Take 5 mLs by mouth 2 (two) times daily as needed for cough. 10/15/22   Trevor Iha, FNP  rosuvastatin (CRESTOR) 20 MG tablet Take 20 mg by mouth daily.    [provider]  VITAMIN D, CHOLECALCIFEROL, PO Take by mouth.    [provider]    Family History Family History  Problem Relation Age of Onset   COPD Mother    Arrhythmia  Mother    Colon cancer Father    Hypertension Father    Pulmonary fibrosis Father    Stroke Maternal Grandmother    Heart attack Maternal Grandfather    Ovarian cancer Paternal Grandmother    Breast cancer Neg Hx    Rectal cancer Neg Hx    Stomach cancer Neg Hx    Esophageal cancer Neg Hx     Social History Social History   Tobacco Use   Smoking status: Every Day    Current packs/day: 0.50    Average packs/day: 0.5 packs/day for 32.0 years (16.0 ttl pk-yrs)    Types: Cigarettes   Smokeless tobacco: Never  Vaping Use   Vaping status: Never Used  Substance Use Topics   Alcohol use: Yes    Comment: rarely, wine    Drug use: No     Allergies   Cymbalta [duloxetine hcl], Latex, and Morphine  and codeine   Review of Systems Review of Systems  Genitourinary:  Positive for dysuria and frequency.  All other systems reviewed and are negative.    Physical Exam Triage Vital Signs ED Triage Vitals  Encounter Vitals Group     BP 06/14/23 1028 106/74     Systolic BP Percentile --      Diastolic BP Percentile --      Pulse Rate 06/14/23 1028 73     Resp 06/14/23 1028 16     Temp 06/14/23 1028 98 F (36.7 C)     Temp Source 06/14/23 1028 Oral     SpO2 06/14/23 1028 98 %     Weight --      Height --      Head Circumference --      Peak Flow --      Pain Score 06/14/23 1026 3     Pain Loc --      Pain Education --      Exclude from Growth Chart --    No data found.  Updated Vital Signs BP 106/74 (BP Location: Right Arm)   Pulse 73   Temp 98 F (36.7 C) (Oral)   Resp 16   SpO2 98%    Physical Exam Vitals and nursing note reviewed.  Constitutional:      Appearance: Normal appearance. She is normal weight.  HENT:     Head: Normocephalic and atraumatic.     Mouth/Throat:     Mouth: Mucous membranes are moist.     Pharynx: Oropharynx is clear.  Eyes:     Extraocular Movements: Extraocular movements intact.     Conjunctiva/sclera: Conjunctivae normal.     Pupils: Pupils are equal, round, and reactive to light.  Cardiovascular:     Rate and Rhythm: Normal rate and regular rhythm.  Pulmonary:     Effort: Pulmonary effort is normal.     Breath sounds: Normal breath sounds. No wheezing, rhonchi or rales.  Musculoskeletal:        General: Normal range of motion.     Cervical back: Normal range of motion and neck supple.  Skin:    General: Skin is warm and dry.  Neurological:     General: No focal deficit present.     Mental Status: She is alert and oriented to person, place, and time. Mental status is at baseline.  Psychiatric:        Mood and Affect: Mood normal.        Behavior: Behavior normal.        Thought Content: Thought  content normal.      UC  Treatments / Results  Labs (all labs ordered are listed, but only abnormal results are displayed) Labs Reviewed  POCT URINALYSIS DIP (MANUAL ENTRY) - Abnormal; Notable for the following components:      Result Value   Blood, UA small (*)    Leukocytes, UA Trace (*)    All other components within normal limits  URINE CULTURE    EKG   Radiology No results found.  Procedures Procedures (including critical care time)  Medications Ordered in UC Medications - No data to display  Initial Impression / Assessment and Plan / UC Course  I have reviewed the triage vital signs and the nursing notes.  Pertinent labs & imaging results that were available during my care of the patient were reviewed by me and considered in my medical decision making (see chart for details).     MDM: 1.  Acute cystitis with hematuria-Rx'd Bactrim DS 800/160 mg tablet twice daily x 7 days; 2.  Dysuria-UA revealed above, urine culture ordered, Rx'd Bactrim DS 800/160 mg tablet twice daily x 7 days. Advised patient to take medication as directed with food to completion.  Encouraged increase daily water intake to 64 ounces per day while taking this medication.  Advised we will follow-up with urine culture results once received.  Advised if symptoms worsen and/or unresolved please follow-up with PCP, or here for further evaluation.  Final Clinical Impressions(s) / UC Diagnoses   Final diagnoses:  Acute cystitis with hematuria  Dysuria     Discharge Instructions      Advised patient to take medication as directed with food to completion.  Encouraged increase daily water intake to 64 ounces per day while taking this medication.  Advised we will follow-up with urine culture results once received.  Advised if symptoms worsen and/or unresolved please follow-up with PCP, or here for further evaluation.     ED Prescriptions     Medication Sig Dispense Auth. Provider   sulfamethoxazole-trimethoprim (BACTRIM DS)  800-160 MG tablet Take 1 tablet by mouth 2 (two) times daily for 7 days. 14 tablet Trevor Iha, FNP      PDMP not reviewed this encounter.   Trevor Iha, FNP 06/14/23 1152

## 2023-06-16 ENCOUNTER — Telehealth: Payer: Self-pay

## 2023-06-16 LAB — URINE CULTURE: Culture: 40000 — AB

## 2023-06-16 MED ORDER — CEFDINIR 300 MG PO CAPS: 300.0000 mg | ORAL_CAPSULE | Freq: Two times a day (BID) | ORAL | 0 refills | Status: AC

## 2023-09-19 ENCOUNTER — Ambulatory Visit
Admission: RE | Admit: 2023-09-19 | Discharge: 2023-09-19 | Disposition: A | Discharge status: Home / Self Care | Payer: Federal, State, Local not specified - PPO | Source: Ambulatory Visit | Attending: Family Medicine | Admitting: Family Medicine

## 2023-09-19 ENCOUNTER — Other Ambulatory Visit: Payer: Self-pay

## 2023-09-19 VITALS — BP 120/76 | HR 94 | Temp 97.4°F | Resp 16

## 2023-09-19 DIAGNOSIS — H938X3 Other specified disorders of ear, bilateral: Secondary | ICD-10-CM | POA: Diagnosis not present

## 2023-09-19 DIAGNOSIS — N3000 Acute cystitis without hematuria: Secondary | ICD-10-CM | POA: Diagnosis not present

## 2023-09-19 LAB — POCT URINALYSIS DIP (MANUAL ENTRY)
Bilirubin, UA: NEGATIVE
Glucose, UA: NEGATIVE mg/dL
Nitrite, UA: NEGATIVE
Protein Ur, POC: 30 mg/dL — AB
Spec Grav, UA: 1.025 (ref 1.010–1.025)
Urobilinogen, UA: 1 U/dL
pH, UA: 5.5 (ref 5.0–8.0)

## 2023-09-19 MED ORDER — NITROFURANTOIN MONOHYD MACRO 100 MG PO CAPS: 100.0000 mg | ORAL_CAPSULE | Freq: Two times a day (BID) | ORAL | 0 refills | Status: DC

## 2023-09-19 MED ORDER — PHENAZOPYRIDINE HCL 200 MG PO TABS: 200.0000 mg | ORAL_TABLET | Freq: Three times a day (TID) | ORAL | 0 refills | Status: AC

## 2023-09-19 NOTE — ED Provider Notes (Signed)
[facility]    CSN: [id number] Arrival date & time: 09/19/23  1401      History   Chief Complaint Chief Complaint  Patient presents with   Urinary Frequency    HPI Stephanie Deleon is a 62 y.o. female.   HPI  Patient is here for 2 problems.  First she has a urinary tract infection.  She states she has been getting about 3 or 4 bladder infections a year.  This is only been since menopause.  She questions why.  We discussed the physiologic changes after menopause, and ways to prevent UTI.  Currently has dysuria, frequency  Patient also complains that she has a cold and has bilateral ear fullness.  She would like her ears looked at to make sure there is no infection  Past Medical History:  Diagnosis Date   Allergy    Anxiety    Cold 12/2013   cough   Colonic polyp    Constipation    Depression    Family history of anesthesia complication    mother N/V   Headache(784.0)    due to neck   Hyperlipidemia    IBS (irritable bowel syndrome)    Lumbar disc disease     Patient Active Problem List   Diagnosis Date Noted   Lateral epicondylitis, right elbow 02/15/2021   Cervical spondylosis without myelopathy 01/01/2014    Class: Diagnosis of   Cervical spondylosis 01/01/2014   Acute upper respiratory infection 08/31/2008   Sinusitis, chronic 06/02/2008   DEPRESSION 11/25/2007   GASTROESOPHAGEAL REFLUX DISEASE 11/25/2007   IRRITABLE BOWEL SYNDROME 11/25/2007   COLONIC POLYPS, HYPERPLASTIC 12/26/2001   Diaphragmatic hernia 12/26/2001    Past Surgical History:  Procedure Laterality Date   ABDOMINAL HYSTERECTOMY     ANTERIOR CERVICAL DECOMP/DISCECTOMY FUSION N/A 01/01/2014   Procedure: ANTERIOR CERVICAL DECOMPRESSION/DISCECTOMY FUSION 2 LEVELS;  Surgeon: Eldred Manges, MD;  Location: [facility] OR;  Service: Orthopedics;  Laterality: N/A;  C4-5, C5-6 Anterior Cervical Discectomy, Fusion, Allograft, Plate and Screws   BACK SURGERY     lumbr   BREAST SURGERY  2012    reduction   COLONOSCOPY  01/05/2021   09/18/18, 2011, 2003   POLYPECTOMY     REDUCTION MAMMAPLASTY Bilateral    TONSILLECTOMY      OB History   No obstetric history on file.      Home Medications    Prior to Admission medications   Medication Sig Start Date End Date Taking? Authorizing Provider  nitrofurantoin, macrocrystal-monohydrate, (MACROBID) 100 MG capsule Take 1 capsule (100 mg total) by mouth 2 (two) times daily. 09/19/23  Yes Eustace Moore, MD  phenazopyridine (PYRIDIUM) 200 MG tablet Take 1 tablet (200 mg total) by mouth 3 (three) times daily. 09/19/23  Yes Eustace Moore, MD  montelukast (SINGULAIR) 10 MG tablet Take 10 mg by mouth daily. 07/10/22   [provider]  pantoprazole (PROTONIX) 40 MG tablet Take 1 tablet (40 mg total) by mouth daily. 03/29/21   Hilarie Fredrickson, MD  rosuvastatin (CRESTOR) 20 MG tablet Take 20 mg by mouth daily.    [provider]  VITAMIN D, CHOLECALCIFEROL, PO Take by mouth.    [provider]    Family History Family History  Problem Relation Age of Onset   COPD Mother    Arrhythmia Mother    Colon cancer Father    Hypertension Father    Pulmonary fibrosis Father    Stroke Maternal Grandmother  Heart attack Maternal Grandfather    Ovarian cancer Paternal Grandmother    Breast cancer Neg Hx    Rectal cancer Neg Hx    Stomach cancer Neg Hx    Esophageal cancer Neg Hx     Social History Social History   Tobacco Use   Smoking status: Every Day    Current packs/day: 0.50    Average packs/day: 0.5 packs/day for 32.0 years (16.0 ttl pk-yrs)    Types: Cigarettes   Smokeless tobacco: Never  Vaping Use   Vaping status: Never Used  Substance Use Topics   Alcohol use: Yes    Comment: rarely, wine    Drug use: No     Allergies   Cymbalta [duloxetine hcl], Latex, and Morphine and codeine   Review of Systems Review of Systems See HPI  Physical Exam Triage Vital Signs ED Triage Vitals   Encounter Vitals Group     BP 09/19/23 1404 120/76     Systolic BP Percentile --      Diastolic BP Percentile --      Pulse Rate 09/19/23 1404 94     Resp 09/19/23 1404 16     Temp 09/19/23 1404 (!) 97.4 F (36.3 C)     Temp src --      SpO2 09/19/23 1404 99 %     Weight --      Height --      Head Circumference --      Peak Flow --      Pain Score 09/19/23 1408 6     Pain Loc --      Pain Education --      Exclude from Growth Chart --    No data found.  Updated Vital Signs BP 120/76   Pulse 94   Temp (!) 97.4 F (36.3 C)   Resp 16   SpO2 99%     Physical Exam Constitutional:      General: She is not in acute distress.    Appearance: She is well-developed. She is not ill-appearing.  HENT:     Head: Normocephalic and atraumatic.     Right Ear: Tympanic membrane, ear canal and external ear normal.     Left Ear: Tympanic membrane, ear canal and external ear normal.     Nose: Congestion present.     Mouth/Throat:     Pharynx: No posterior oropharyngeal erythema.  Eyes:     Conjunctiva/sclera: Conjunctivae normal.     Pupils: Pupils are equal, round, and reactive to light.  Cardiovascular:     Rate and Rhythm: Normal rate.  Pulmonary:     Effort: Pulmonary effort is normal. No respiratory distress.  Abdominal:     General: There is no distension.     Palpations: Abdomen is soft.     Tenderness: There is no right CVA tenderness or left CVA tenderness.  Musculoskeletal:        General: Normal range of motion.     Cervical back: Normal range of motion.  Lymphadenopathy:     Cervical: No cervical adenopathy.  Skin:    General: Skin is warm and dry.  Neurological:     Mental Status: She is alert.      UC Treatments / Results  Labs (all labs ordered are listed, but only abnormal results are displayed) Labs Reviewed  POCT URINALYSIS DIP (MANUAL ENTRY) - Abnormal; Notable for the following components:      Result Value   Clarity, UA cloudy (*)  Ketones,  POC UA small (15) (*)    Blood, UA large (*)    Protein Ur, POC =30 (*)    Leukocytes, UA Small (1+) (*)    All other components within normal limits  URINE CULTURE    EKG   Radiology No results found.  Procedures Procedures (including critical care time)  Medications Ordered in UC Medications - No data to display  Initial Impression / Assessment and Plan / UC Course  I have reviewed the triage vital signs and the nursing notes.  Pertinent labs & imaging results that were available during my care of the patient were reviewed by me and considered in my medical decision making (see chart for details).     Final Clinical Impressions(s) / UC Diagnoses   Final diagnoses:  Acute cystitis without hematuria  Ear congestion, bilateral     Discharge Instructions      Take the antibiotic nitrofurantoin 2 times a day for 5 days Take the Pyridium 3 times a day for urinary discomfort.  Your urine will look orange  Make sure you continue to drink lots of water  Your urine has been sent to the laboratory for culture.  You will be called if any change in antibiotics is indicated   ED Prescriptions     Medication Sig Dispense Auth. Provider   nitrofurantoin, macrocrystal-monohydrate, (MACROBID) 100 MG capsule Take 1 capsule (100 mg total) by mouth 2 (two) times daily. 10 capsule Eustace Moore, MD   phenazopyridine (PYRIDIUM) 200 MG tablet Take 1 tablet (200 mg total) by mouth 3 (three) times daily. 6 tablet Eustace Moore, MD      PDMP not reviewed this encounter.   Eustace Moore, MD 09/19/23 [id number]

## 2023-09-19 NOTE — Discharge Instructions (Signed)
Take the antibiotic nitrofurantoin 2 times a day for 5 days Take the Pyridium 3 times a day for urinary discomfort.  Your urine will look orange  Make sure you continue to drink lots of water  Your urine has been sent to the laboratory for culture.  You will be called if any change in antibiotics is indicated

## 2023-09-19 NOTE — ED Triage Notes (Signed)
Urinary frequency starting today. Also complaints of ear fullness bilaterally. No fever. Taking mucinex and nyquil.

## 2023-09-21 LAB — URINE CULTURE
Culture: 10000 — AB
Special Requests: NORMAL

## 2023-09-30 ENCOUNTER — Ambulatory Visit
Admission: RE | Admit: 2023-09-30 | Discharge: 2023-09-30 | Disposition: A | Discharge status: Home / Self Care | Payer: Federal, State, Local not specified - PPO | Source: Ambulatory Visit | Attending: Family Medicine | Admitting: Family Medicine

## 2023-09-30 ENCOUNTER — Telehealth: Payer: Self-pay

## 2023-09-30 VITALS — BP 118/83 | HR 91 | Temp 97.6°F | Resp 17

## 2023-09-30 DIAGNOSIS — R059 Cough, unspecified: Secondary | ICD-10-CM | POA: Diagnosis not present

## 2023-09-30 DIAGNOSIS — J069 Acute upper respiratory infection, unspecified: Secondary | ICD-10-CM

## 2023-09-30 MED ORDER — HYDROCODONE BIT-HOMATROP MBR 5-1.5 MG/5ML PO SOLN: 5.0000 mL | Freq: Four times a day (QID) | ORAL | 0 refills | Status: AC | PRN

## 2023-09-30 MED ORDER — DOXYCYCLINE HYCLATE 100 MG PO CAPS: 100.0000 mg | ORAL_CAPSULE | Freq: Two times a day (BID) | ORAL | 0 refills | Status: AC

## 2023-09-30 MED ORDER — PREDNISONE 10 MG (21) PO TBPK: ORAL_TABLET | Freq: Every day | ORAL | 0 refills | Status: AC

## 2023-09-30 NOTE — Discharge Instructions (Addendum)
Advised patient to take medications as directed with food to completion.  Advised patient to take prednisone with first dose of doxycycline until complete.  Advised may take Hycodan for cough prior to sleep due to sedative effects.  Encouraged to increase daily water intake to 64 ounces per day while taking these medications.  Advised if symptoms worsen and/or unresolved please follow-up with PCP or here for further evaluation.

## 2023-09-30 NOTE — ED Provider Notes (Signed)
[facility]    CSN: [id number] Arrival date & time: 09/30/23  4098      History   Chief Complaint Chief Complaint  Patient presents with   Cough    Entered by patient    HPI Stephanie Deleon is a 62 y.o. female.   HPI PMH significant for IBS, lumbar disc disease, and HLD.  Past Medical History:  Diagnosis Date   Allergy    Anxiety    Cold 12/2013   cough   Colonic polyp    Constipation    Depression    Family history of anesthesia complication    mother N/V   Headache(784.0)    due to neck   Hyperlipidemia    IBS (irritable bowel syndrome)    Lumbar disc disease     Patient Active Problem List   Diagnosis Date Noted   Lateral epicondylitis, right elbow 02/15/2021   Cervical spondylosis without myelopathy 01/01/2014    Class: Diagnosis of   Cervical spondylosis 01/01/2014   Acute upper respiratory infection 08/31/2008   Sinusitis, chronic 06/02/2008   DEPRESSION 11/25/2007   GASTROESOPHAGEAL REFLUX DISEASE 11/25/2007   IRRITABLE BOWEL SYNDROME 11/25/2007   COLONIC POLYPS, HYPERPLASTIC 12/26/2001   Diaphragmatic hernia 12/26/2001    Past Surgical History:  Procedure Laterality Date   ABDOMINAL HYSTERECTOMY     ANTERIOR CERVICAL DECOMP/DISCECTOMY FUSION N/A 01/01/2014   Procedure: ANTERIOR CERVICAL DECOMPRESSION/DISCECTOMY FUSION 2 LEVELS;  Surgeon: Eldred Manges, MD;  Location: [facility] OR;  Service: Orthopedics;  Laterality: N/A;  C4-5, C5-6 Anterior Cervical Discectomy, Fusion, Allograft, Plate and Screws   BACK SURGERY     lumbr   BREAST SURGERY  2012   reduction   COLONOSCOPY  01/05/2021   09/18/18, 2011, 2003   POLYPECTOMY     REDUCTION MAMMAPLASTY Bilateral    TONSILLECTOMY      OB History   No obstetric history on file.      Home Medications    Prior to Admission medications   Medication Sig Start Date End Date Taking? Authorizing Provider  doxycycline (VIBRAMYCIN) 100 MG capsule Take 1 capsule (100 mg total) by mouth 2 (two)  times daily for 7 days. 09/30/23 10/07/23 Yes Trevor Iha, FNP  HYDROcodone bit-homatropine (HYCODAN) 5-1.5 MG/5ML syrup Take 5 mLs by mouth every 6 (six) hours as needed for cough. 09/30/23  Yes Trevor Iha, FNP  predniSONE (STERAPRED UNI-PAK 21 TAB) 10 MG (21) TBPK tablet Take by mouth daily. Take 6 tabs by mouth daily  for 2 days, then 5 tabs for 2 days, then 4 tabs for 2 days, then 3 tabs for 2 days, 2 tabs for 2 days, then 1 tab by mouth daily for 2 days 09/30/23  Yes Trevor Iha, FNP  montelukast (SINGULAIR) 10 MG tablet Take 10 mg by mouth daily. 07/10/22   [provider]  pantoprazole (PROTONIX) 40 MG tablet Take 1 tablet (40 mg total) by mouth daily. 03/29/21   Hilarie Fredrickson, MD  phenazopyridine (PYRIDIUM) 200 MG tablet Take 1 tablet (200 mg total) by mouth 3 (three) times daily. 09/19/23   Eustace Moore, MD  rosuvastatin (CRESTOR) 20 MG tablet Take 20 mg by mouth daily.    [provider]  VITAMIN D, CHOLECALCIFEROL, PO Take by mouth.    [provider]    Family History Family History  Problem Relation Age of Onset   COPD Mother    Arrhythmia Mother    Colon cancer Father    Hypertension  Father    Pulmonary fibrosis Father    Stroke Maternal Grandmother    Heart attack Maternal Grandfather    Ovarian cancer Paternal Grandmother    Breast cancer Neg Hx    Rectal cancer Neg Hx    Stomach cancer Neg Hx    Esophageal cancer Neg Hx     Social History Social History   Tobacco Use   Smoking status: Every Day    Current packs/day: 0.50    Average packs/day: 0.5 packs/day for 32.0 years (16.0 ttl pk-yrs)    Types: Cigarettes   Smokeless tobacco: Never  Vaping Use   Vaping status: Never Used  Substance Use Topics   Alcohol use: Yes    Comment: rarely, wine    Drug use: No     Allergies   Cymbalta [duloxetine hcl], Latex, and Morphine and codeine   Review of Systems Review of Systems  HENT:  Positive for congestion.    Respiratory:  Positive for cough.   All other systems reviewed and are negative.    Physical Exam Triage Vital Signs ED Triage Vitals  Encounter Vitals Group     BP      Systolic BP Percentile      Diastolic BP Percentile      Pulse      Resp      Temp      Temp src      SpO2      Weight      Height      Head Circumference      Peak Flow      Pain Score      Pain Loc      Pain Education      Exclude from Growth Chart    No data found.  Updated Vital Signs BP 118/83 (BP Location: Right Arm)   Pulse 91   Temp 97.6 F (36.4 C) (Oral)   Resp 17   SpO2 96%       Physical Exam Vitals and nursing note reviewed.  Constitutional:      Appearance: Normal appearance. She is obese. She is ill-appearing.  HENT:     Head: Normocephalic and atraumatic.     Right Ear: Tympanic membrane, ear canal and external ear normal.     Left Ear: Tympanic membrane, ear canal and external ear normal.     Mouth/Throat:     Mouth: Mucous membranes are moist.     Pharynx: Oropharynx is clear.  Eyes:     Extraocular Movements: Extraocular movements intact.     Conjunctiva/sclera: Conjunctivae normal.     Pupils: Pupils are equal, round, and reactive to light.  Cardiovascular:     Rate and Rhythm: Normal rate and regular rhythm.     Pulses: Normal pulses.     Heart sounds: Normal heart sounds.  Pulmonary:     Effort: Pulmonary effort is normal.     Breath sounds: Normal breath sounds. No wheezing, rhonchi or rales.     Comments: Frequent nonproductive cough noted on exam Musculoskeletal:        General: Normal range of motion.     Cervical back: Normal range of motion and neck supple.  Skin:    General: Skin is warm and dry.  Neurological:     General: No focal deficit present.     Mental Status: She is alert and oriented to person, place, and time. Mental status is at baseline.      UC Treatments /  Results  Labs (all labs ordered are listed, but only abnormal results are  displayed) Labs Reviewed - No data to display  EKG   Radiology No results found.  Procedures Procedures (including critical care time)  Medications Ordered in UC Medications - No data to display  Initial Impression / Assessment and Plan / UC Course  I have reviewed the triage vital signs and the nursing notes.  Pertinent labs & imaging results that were available during my care of the patient were reviewed by me and considered in my medical decision making (see chart for details).     MDM: 1.  Acute upper respiratory infection-Rx'd doxycycline 100 mg capsule: Take 1 capsule twice daily x 7 days; 2.  Cough, unspecified type Rx'd Sterapred Unipak (tapering from 60 mg to 10 mg over 10 days), Rx'd Hycodan 5-1.5 mg / 5 mL syrup: Take 5 mL every 6 hours as needed for cough. Advised patient to take medications as directed with food to completion.  Advised patient to take prednisone with first dose of doxycycline until complete.  Advised may take Hycodan for cough prior to sleep due to sedative effects.  Encouraged to increase daily water intake to 64 ounces per day while taking these medications.  Advised if symptoms worsen and/or unresolved please follow-up with PCP or here for further evaluation.  Patient discharged home, hemodynamically stable. Final Clinical Impressions(s) / UC Diagnoses   Final diagnoses:  Cough, unspecified type  Acute upper respiratory infection     Discharge Instructions      Advised patient to take medications as directed with food to completion.  Advised patient to take prednisone with first dose of doxycycline until complete.  Advised may take Hycodan for cough prior to sleep due to sedative effects.  Encouraged to increase daily water intake to 64 ounces per day while taking these medications.  Advised if symptoms worsen and/or unresolved please follow-up with PCP or here for further evaluation.     ED Prescriptions     Medication Sig Dispense Auth.  Provider   doxycycline (VIBRAMYCIN) 100 MG capsule Take 1 capsule (100 mg total) by mouth 2 (two) times daily for 7 days. 14 capsule Trevor Iha, FNP   predniSONE (STERAPRED UNI-PAK 21 TAB) 10 MG (21) TBPK tablet Take by mouth daily. Take 6 tabs by mouth daily  for 2 days, then 5 tabs for 2 days, then 4 tabs for 2 days, then 3 tabs for 2 days, 2 tabs for 2 days, then 1 tab by mouth daily for 2 days 42 tablet Trevor Iha, FNP   HYDROcodone bit-homatropine (HYCODAN) 5-1.5 MG/5ML syrup Take 5 mLs by mouth every 6 (six) hours as needed for cough. 120 mL Trevor Iha, FNP      PDMP not reviewed this encounter.   Trevor Iha, FNP 09/30/23 1013

## 2023-09-30 NOTE — ED Triage Notes (Signed)
Pt c/o cough x 3 weeks. Denies fever. Taking mucinex and ibuprofen prn.

## 2024-01-03 DIAGNOSIS — Z888 Allergy status to other drugs, medicaments and biological substances status: Secondary | ICD-10-CM | POA: Diagnosis not present

## 2024-01-03 DIAGNOSIS — H05012 Cellulitis of left orbit: Secondary | ICD-10-CM | POA: Diagnosis not present

## 2024-01-03 DIAGNOSIS — Z91040 Latex allergy status: Secondary | ICD-10-CM | POA: Diagnosis not present

## 2024-01-03 DIAGNOSIS — L03213 Periorbital cellulitis: Secondary | ICD-10-CM | POA: Diagnosis not present

## 2024-01-03 DIAGNOSIS — H00036 Abscess of eyelid left eye, unspecified eyelid: Secondary | ICD-10-CM | POA: Diagnosis not present

## 2024-01-03 DIAGNOSIS — F17210 Nicotine dependence, cigarettes, uncomplicated: Secondary | ICD-10-CM | POA: Diagnosis not present

## 2024-01-03 DIAGNOSIS — Z885 Allergy status to narcotic agent status: Secondary | ICD-10-CM | POA: Diagnosis not present

## 2024-01-03 DIAGNOSIS — H02846 Edema of left eye, unspecified eyelid: Secondary | ICD-10-CM | POA: Diagnosis not present

## 2024-01-03 DIAGNOSIS — R220 Localized swelling, mass and lump, head: Secondary | ICD-10-CM | POA: Diagnosis not present

## 2024-01-03 DIAGNOSIS — H5712 Ocular pain, left eye: Secondary | ICD-10-CM | POA: Diagnosis not present

## 2024-01-10 DIAGNOSIS — L03213 Periorbital cellulitis: Secondary | ICD-10-CM | POA: Diagnosis not present

## 2024-01-10 DIAGNOSIS — Z1231 Encounter for screening mammogram for malignant neoplasm of breast: Secondary | ICD-10-CM | POA: Diagnosis not present

## 2024-01-10 DIAGNOSIS — Z1211 Encounter for screening for malignant neoplasm of colon: Secondary | ICD-10-CM | POA: Diagnosis not present

## 2024-01-10 DIAGNOSIS — Z1330 Encounter for screening examination for mental health and behavioral disorders, unspecified: Secondary | ICD-10-CM | POA: Diagnosis not present

## 2024-01-14 DIAGNOSIS — L03213 Periorbital cellulitis: Secondary | ICD-10-CM | POA: Diagnosis not present

## 2024-01-14 DIAGNOSIS — E785 Hyperlipidemia, unspecified: Secondary | ICD-10-CM | POA: Diagnosis not present

## 2024-01-14 DIAGNOSIS — J301 Allergic rhinitis due to pollen: Secondary | ICD-10-CM | POA: Diagnosis not present

## 2024-01-14 DIAGNOSIS — J329 Chronic sinusitis, unspecified: Secondary | ICD-10-CM | POA: Diagnosis not present
